# Patient Record
Sex: Female | Born: 1980 | Race: Black or African American | Hispanic: No | Marital: Single | Smoking: Current some day smoker
Health system: Southern US, Community
[De-identification: ages and names within clinical notes are randomized; demographics above are authoritative.]

---
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
## 2002-03-27 ENCOUNTER — Emergency Department (HOSPITAL_COMMUNITY): Admission: EM | Admit: 2002-03-27 | Discharge: 2002-03-27 | Payer: Self-pay | Admitting: Emergency Medicine

## 2002-11-18 ENCOUNTER — Emergency Department (HOSPITAL_COMMUNITY): Admission: EM | Admit: 2002-11-18 | Discharge: 2002-11-18 | Payer: Self-pay | Admitting: Emergency Medicine

## 2003-09-02 ENCOUNTER — Ambulatory Visit (HOSPITAL_COMMUNITY): Admission: RE | Admit: 2003-09-02 | Discharge: 2003-09-02 | Payer: Self-pay | Admitting: *Deleted

## 2003-09-02 ENCOUNTER — Encounter: Payer: Self-pay | Admitting: *Deleted

## 2003-12-04 ENCOUNTER — Ambulatory Visit (HOSPITAL_COMMUNITY): Admission: RE | Admit: 2003-12-04 | Discharge: 2003-12-04 | Payer: Self-pay | Admitting: *Deleted

## 2004-01-03 ENCOUNTER — Ambulatory Visit (HOSPITAL_COMMUNITY): Admission: EM | Admit: 2004-01-03 | Discharge: 2004-01-03 | Payer: Self-pay | Admitting: Emergency Medicine

## 2004-01-22 ENCOUNTER — Inpatient Hospital Stay (HOSPITAL_COMMUNITY): Admission: RE | Admit: 2004-01-22 | Discharge: 2004-01-25 | Payer: Self-pay | Admitting: *Deleted

## 2005-01-17 ENCOUNTER — Emergency Department (HOSPITAL_COMMUNITY): Admission: EM | Admit: 2005-01-17 | Discharge: 2005-01-17 | Payer: Self-pay | Admitting: Emergency Medicine

## 2005-02-01 ENCOUNTER — Emergency Department (HOSPITAL_COMMUNITY): Admission: EM | Admit: 2005-02-01 | Discharge: 2005-02-01 | Payer: Self-pay | Admitting: Emergency Medicine

## 2005-03-05 ENCOUNTER — Ambulatory Visit (HOSPITAL_COMMUNITY): Admission: RE | Admit: 2005-03-05 | Discharge: 2005-03-05 | Payer: Self-pay | Admitting: *Deleted

## 2005-05-14 ENCOUNTER — Emergency Department (HOSPITAL_COMMUNITY): Admission: EM | Admit: 2005-05-14 | Discharge: 2005-05-14 | Payer: Self-pay | Admitting: Emergency Medicine

## 2005-08-12 ENCOUNTER — Emergency Department (HOSPITAL_COMMUNITY): Admission: EM | Admit: 2005-08-12 | Discharge: 2005-08-13 | Payer: Self-pay | Admitting: Emergency Medicine

## 2005-11-29 ENCOUNTER — Emergency Department (HOSPITAL_COMMUNITY): Admission: EM | Admit: 2005-11-29 | Discharge: 2005-11-29 | Payer: Self-pay | Admitting: Emergency Medicine

## 2005-12-19 ENCOUNTER — Emergency Department (HOSPITAL_COMMUNITY): Admission: EM | Admit: 2005-12-19 | Discharge: 2005-12-19 | Payer: Self-pay | Admitting: Emergency Medicine

## 2005-12-22 ENCOUNTER — Emergency Department (HOSPITAL_COMMUNITY): Admission: EM | Admit: 2005-12-22 | Discharge: 2005-12-22 | Payer: Self-pay | Admitting: Emergency Medicine

## 2006-04-11 ENCOUNTER — Emergency Department (HOSPITAL_COMMUNITY): Admission: EM | Admit: 2006-04-11 | Discharge: 2006-04-11 | Payer: Self-pay | Admitting: Emergency Medicine

## 2007-03-17 ENCOUNTER — Emergency Department (HOSPITAL_COMMUNITY): Admission: EM | Admit: 2007-03-17 | Discharge: 2007-03-17 | Payer: Self-pay | Admitting: Emergency Medicine

## 2007-06-16 ENCOUNTER — Emergency Department (HOSPITAL_COMMUNITY): Admission: EM | Admit: 2007-06-16 | Discharge: 2007-06-16 | Payer: Self-pay | Admitting: Emergency Medicine

## 2007-11-05 ENCOUNTER — Emergency Department (HOSPITAL_COMMUNITY): Admission: EM | Admit: 2007-11-05 | Discharge: 2007-11-05 | Payer: Self-pay | Admitting: Emergency Medicine

## 2007-12-16 ENCOUNTER — Emergency Department (HOSPITAL_COMMUNITY): Admission: EM | Admit: 2007-12-16 | Discharge: 2007-12-16 | Payer: Self-pay | Admitting: Emergency Medicine

## 2008-02-04 ENCOUNTER — Emergency Department (HOSPITAL_COMMUNITY): Admission: EM | Admit: 2008-02-04 | Discharge: 2008-02-04 | Payer: Self-pay | Admitting: Emergency Medicine

## 2008-03-21 ENCOUNTER — Other Ambulatory Visit: Admission: RE | Admit: 2008-03-21 | Discharge: 2008-03-21 | Payer: Self-pay | Admitting: Obstetrics and Gynecology

## 2008-05-04 ENCOUNTER — Ambulatory Visit: Payer: Self-pay | Admitting: Obstetrics & Gynecology

## 2008-05-04 ENCOUNTER — Inpatient Hospital Stay (HOSPITAL_COMMUNITY): Admission: AD | Admit: 2008-05-04 | Discharge: 2008-05-04 | Payer: Self-pay | Admitting: Obstetrics & Gynecology

## 2008-06-03 ENCOUNTER — Ambulatory Visit: Payer: Self-pay | Admitting: Psychiatry

## 2008-06-03 ENCOUNTER — Other Ambulatory Visit: Payer: Self-pay | Admitting: Emergency Medicine

## 2008-06-04 ENCOUNTER — Inpatient Hospital Stay (HOSPITAL_COMMUNITY): Admission: RE | Admit: 2008-06-04 | Discharge: 2008-06-10 | Payer: Self-pay | Admitting: Psychiatry

## 2008-06-06 ENCOUNTER — Ambulatory Visit: Payer: Self-pay | Admitting: *Deleted

## 2008-08-16 ENCOUNTER — Inpatient Hospital Stay (HOSPITAL_COMMUNITY): Admission: AD | Admit: 2008-08-16 | Discharge: 2008-08-16 | Payer: Self-pay | Admitting: Obstetrics & Gynecology

## 2008-08-30 ENCOUNTER — Inpatient Hospital Stay (HOSPITAL_COMMUNITY): Admission: RE | Admit: 2008-08-30 | Discharge: 2008-09-02 | Payer: Self-pay | Admitting: Obstetrics and Gynecology

## 2008-08-30 ENCOUNTER — Encounter: Payer: Self-pay | Admitting: Obstetrics and Gynecology

## 2008-11-20 ENCOUNTER — Emergency Department (HOSPITAL_COMMUNITY): Admission: EM | Admit: 2008-11-20 | Discharge: 2008-11-20 | Payer: Self-pay | Admitting: Emergency Medicine

## 2009-02-28 ENCOUNTER — Emergency Department (HOSPITAL_COMMUNITY): Admission: EM | Admit: 2009-02-28 | Discharge: 2009-02-28 | Payer: Self-pay | Admitting: Emergency Medicine

## 2010-12-06 ENCOUNTER — Encounter: Payer: Self-pay | Admitting: Obstetrics & Gynecology

## 2010-12-07 ENCOUNTER — Encounter: Payer: Self-pay | Admitting: Obstetrics and Gynecology

## 2010-12-07 ENCOUNTER — Encounter: Payer: Self-pay | Admitting: *Deleted

## 2011-02-24 LAB — CULTURE, ROUTINE-ABSCESS

## 2011-03-30 NOTE — H&P (Signed)
NAME:  [name], [name] NO.:  [id number]   MEDICAL RECORD NO.:  [id number]           PATIENT TYPE:   LOCATION:                                 FACILITY:   PHYSICIAN:  Tilda Burrow, M.D. DATE OF BIRTH:  1981-07-24   DATE OF ADMISSION:  08/30/2008  DATE OF DISCHARGE:                              HISTORY & PHYSICAL   ADMITTING DIAGNOSES:  Pregnancy at 37 weeks and 6 days, twin pregnancy,  repeat C-section x2, not for trial of labor, growth discordancy, baby A  68th percentile, and baby B 35th percentile.   HISTORY OF PRESENT ILLNESS:  This 30 year old female gravida 4, para 2-0-  1-2 now at 37 weeks and 6 days by consensus criteria is followed by  pregnancy at [facility].  Pregnancy course has been notable for  moderate growth discordancy in the babies.  By 33 weeks, there was  asymmetry from the 23rd percentile for baby B and 55% percentile for  baby A.  Serial ultrasounds have been performed and at this point, both  babies were actually increasing in their growth percentile compared to  average infant.  On August 15, 2008, baby A measured 68th percentile and  baby B 35th percentile when checked at [facility].  NSTs have  been reactive biweekly, most recently August 26, 2008.  The patient  does not want tubal ligation.  Throughout her pregnancy, she states that  recently she has become ambivalent and changed her mind.  She is  undecided on her other contraception plan.   PRENATAL COURSE:  Blood type O positive.  UDS positive for __________  and cocaine __________.  She was counseled and we have no further  documentation of abnormalities.  She had an uneventful course otherwise.   SOCIAL HISTORY:  Interesting and the patient is getting ready to move to  [facility] with the patient relocating this week and she will be taking  the babies home to [place], [place] of state.   PAST SURGICAL HISTORY:  Positive for C-section x2 described as  uneventful.  The patient states that she felt the skin closure at the  second C-section.   ALLERGIES:  None.   SOCIAL HISTORY:  Single.  Lives with children and her grandmother.  She  is a Interior and spatial designer.   FAMILY HISTORY:  Positive for hypertension and diabetes.   PRENATAL COURSE:  Blood type O positive.  Rubella immune.  Hepatitis,  HIV, RPR, GC and chlamydia are all negative.  MSAFP normal.  Glucose  tolerance test 97 mg %.  Sickle trait negative.   PHYSICAL EXAMINATION:  VITAL SIGNS:  Weight 244 and blood pressure  140/80.  HEENT:  Pupils equal, round, and reactive to light.  NECK:  Supple.  CARDIAC:  Unremarkable.  Fundal height 45 cm with both babies vertex at  the last ultrasound.  NSD reactive on August 26, 2008, both x2 infants.   PLAN:  Repeat C-section at 2 p.m. on August 30, 2008.      Tilda Burrow, M.D.  Electronically Signed     JVF/MEDQ  D:  08/27/2008  T:  08/28/2008  Job:  [id number]   cc:   [facility]

## 2011-03-30 NOTE — Discharge Summary (Signed)
[name], [name]               ACCOUNT NO.:  [id number]   MEDICAL RECORD NO.:  [id number]          PATIENT TYPE:  INP   LOCATION:  9199                          [facility]ACILITY:  [facility]   PHYSICIAN:  Rodney Langton, MDDATE OF BIRTH:  Apr 05, 1981   DATE OF ADMISSION:  08/30/2008  DATE OF DISCHARGE:  09/02/2008                               DISCHARGE SUMMARY   REASON FOR ADMISSION:  Cesarean section for twins, Baby A: breech and  Baby B: transverse presentation.   DIAGNOSIS AT DISCHARGE:  Status post low-transverse cesarean section for  breech and transverse twins.   PROCEDURES DONE:  Low-transverse cesarean section and nonstress test.   HISTORY:  Briefly, this is a 30 year old G3, P3-0-0-4 female who  presented at 37.6 weeks.  The patient was admitted for repeat cesarean  section as she was not a candidate for trial of labor.  The procedure  went well and she delivered a 5-pound 6-ounce female via breech, first  with Apgars of 9 at one and 9 at five minutes and then a 7-pound 4-ounce  female with Apgars of 9 at one and 9 at five minutes, transverse.  The  patient followed an uneventful course in the hospital, and by discharge,  was ambulatory, eating, afebrile, and pain was controlled.   DIAGNOSIS AT DISCHARGE:  Status post normal spontaneous vaginal delivery  of twins.   DISCHARGE CONDITION:  Stable.   DISCHARGE DISPOSITION:  Will be to home.   ACTIVITY:  Pelvis rest for 6 weeks.   DIET:  Routine.   MEDICATIONS AT DISCHARGE:  1. Percocet 5/325 one to two tabs p.o. q.4-6 h. p.r.n. pain.  2. Colace 100 mg p.o. b.i.d.  3. Ibuprofen 600 mg p.o. q.6 h. p.r.n. pain.  4. Prenatal vitamins 1 tab p.o. daily.   FOLLOWUP:  Will be in 4 weeks at [facility].  The patient is also  instructed to return to [facility] in 2 days on September 04, 2008 to  have her staples removed.      Rodney Langton, MD  Electronically Signed     TT/MEDQ  D:  09/02/2008  T:  09/03/2008  Job:   [id number]

## 2011-03-30 NOTE — Op Note (Signed)
NAMESARIN, COMUNALE               ACCOUNT NO.:  [id number]   MEDICAL RECORD NO.:  [id number]          PATIENT TYPE:  INP   LOCATION:  9130                          [facility]ACILITY:  [facility]   PHYSICIAN:  Tilda Burrow, M.D. DATE OF BIRTH:  [birth year]   DATE OF PROCEDURE:  DATE OF DISCHARGE:                               OPERATIVE REPORT   PREOPERATIVE DIAGNOSIS:  Pregnancy 37 weeks and 6 days, twin gestation,  growth asymmetry, prior C-section x2, now for trial of labor, breech may  be A.   POSTOPERATIVE DIAGNOSES:  1. Pregnancy 37 weeks and 6 days, twin gestation, growth asymmetry,      prior c-section x2, now for trial of labor, breech may be A.  2. Back up tranverse, baby B.   PROCEDURE:  Repeat low-transverse cervical cesarean section.   SURGEON:  Tilda Burrow, M.D.   ASSISTANT:  None.   ANESTHESIA:  Spinal.  Brayton Caves, MD, Anesthesia.   COMPLICATIONS:  None.   FINDINGS:  A 5 pound 6 ounce (2440 g) female infant baby A in breech  position, 7 pound and 4 ounce of (3295 g) female baby B, Apgars 8 and 9 in  back up transverse position, thick uterine tissues with several small  fibroids over the surface of the uterus.  No adhesions.   DETAILS OF PROCEDURE:  The patient was taken to the operating room.  Spinal anesthesia introduced and timeout performed per protocol.  Pfannenstiel incision was performed with easy access to the abdominal  cavity.  Bladder flap was minimally developed and a transverse uterine  incision performed, extended laterally using index finger traction and  the baby A buttocks identified in the breech position.  Breech  extraction was performed using mild fundal pressure and delivery of the  buttocks, sweeping of the legs with flexion of the knees, sweeping of  the arms and then delivery of the head easily.  Cord was clamped of the  infant, baby A, female, passed to awaiting pediatrician.  The subsequent  delivery of baby B was performed with the baby  identified in a back  transverse position with vertex on the patient's right side.  Breech  extraction with fundal pressure applied.  It was easily performed with  standard sweeping of the arms in front of the body, the baby B was 7  pounds 4 ounces.  Apgars 9 and 9 were found.  Placenta was delivered by  Crede uterine massage with IV access infused.  Uterine tone was  excellent.  Small myomas could be palpated through the uterine wall.  The patient once again confirmed she did not want her tubes tied.  We  then proceeded with irrigation of the uterus with saline solution,  suctioning the abdomen, single layer of running locking closure of the  uterine incision.  Bladder flap was loosely reapproximated with 2  interrupted sutures of 2-0 chromic.  The abdomen was irrigated again.  Hemostasis was confirmed and the anterior peritoneum was closed with 2-0  chromic.  The fascia was closed with running 0 Vicryl and abdomen wall  laxity was rather marked.  Subcutaneous tissues were reapproximated  using interrupted 2-0 plain and staple closure of the skin completed the  procedure.  Pressure dressing was applied due to there was diffuse  oozing.   Sponge and needle counts were correct.   ESTIMATED BLOOD LOSS:  800 mL.      Tilda Burrow, M.D.  Electronically Signed     JVF/MEDQ  D:  08/30/2008  T:  08/31/2008  Job:  [id number]
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
## 2011-04-02 NOTE — H&P (Signed)
NAME:  [name], [name]                         ACCOUNT NO.:  [id number]   MEDICAL RECORD NO.:  [id number]                   PATIENT TYPE:  AMB   LOCATION:  DAY                                  [facility]ILITY:  [facility]   PHYSICIAN:  Langley Gauss, M.D.                DATE OF BIRTH:  10/28/1981   DATE OF ADMISSION:  01/22/2004  DATE OF DISCHARGE:                                HISTORY & PHYSICAL   The patient is a 30 year old gravida 2, para 1, previous C-section, at 66-  1/[redacted] weeks gestation, who is admitted for repeat cesarean section.  A tubal  ligation will not be performed.  The patient's prenatal course was  complicated by multiple drug screens which have been positive for marijuana.  Most recent drug screen done December 23, 2003, positive for marijuana.  All  other substances tested were noted to be negative.  December 03, 2003,  positive for marijuana, everything else negative.  However, early in her  prenatal course, specifically November 12, 2003, the patient was noted to  test positive for cocaine and also positive for marijuana.  Pertinently at  that time the patient was adamant regarding her denial of any cocaine usage.  She was advised of the detrimental and very adverse reaction on pregnancy  with cocaine usage.  She was, likewise, advised that she would be having  serial testing performed during the prenatal course.  Prior to that on  August 29, 2003, positive for cocaine and positive for marijuana.  The  patient on all previous occasions was adamant regarding her denial of  cocaine usage.  The patient is noted to be GBS carrier status negative on  January 08, 2004.  She has had serial ultrasounds which have documented  adequate fetal growth.  Glucose tolerance test during previous pregnancy was  noted to be normal.  The patient on October 15, 2003, was noted to have  Trichomonas on Pap smear.  She was treated at that time with Flagyl 500 mg  p.o. b.i.d. x7.  She denies any  abnormal discharge during the remainder of  the pregnancy.   PAST MEDICAL HISTORY:  No known drug allergies.   CURRENT MEDICATIONS:  Prenatal vitamins.   OBSTETRICAL HISTORY:  December 12, 2001, failure to progress, CPD, primary  low transverse cesarean section, delivery of 8-pound 8-ounce female infant.   SOCIAL HISTORY:  Father of the baby is named Candi Leash.  The patient  smokes 4 cigarettes per day.   PHYSICAL EXAMINATION:  GENERAL:  Black female.  VITAL SIGNS:  Height 5 feet 3, prepregnancy 238, most recent 235.  Blood  pressure 126/70, pulse rate of 90, respiratory rate 20.  HEENT:  Negative.  No adenopathy.  NECK:  Supple.  Thyroid is not palpable.  LUNGS:  Clear.  CARDIOVASCULAR:  Regular rate and rhythm.  ABDOMEN:  Soft and nontender.  Pfannenstiel incision is identified from  prior C-section.  She is vertex presentation by Thayer Ohm maneuver.  Fetal  heart tones are auscultated in the 150s.  EXTREMITIES:  Noted to be normal.   ASSESSMENT:  This is a 38-plus-week intrauterine pregnancy, for repeat low  transverse cesarean section.  The patient plans on bottle feeding.  She has  stated a desire for utilizing a skin implant for postpartum birth control  purposes.  The pediatric care is to be provided by Dr. Vivia Ewing.  The  risks and benefits of repeat cesarean section were discussed with the  patient.  Dr. Webb Laws office was contacted on December 24, 2003.  Discussion  and notification via Carollee Herter regarding the planned C-section.     ___________________________________________                                         Langley Gauss, M.D.   DC/MEDQ  D:  01/21/2004  T:  01/22/2004  Job:  [id number]

## 2011-04-02 NOTE — Discharge Summary (Signed)
NAME:  [name], [name]                         ACCOUNT NO.:  [id number]   MEDICAL RECORD NO.:  [id number]                   PATIENT TYPE:  INP   LOCATION:  A403                                 FACILITY:  [facility]   PHYSICIAN:  Langley Gauss, M.D.                DATE OF BIRTH:  11/28/1980   DATE OF ADMISSION:  01/22/2004  DATE OF DISCHARGE:  01/25/2004                                 DISCHARGE SUMMARY   DIAGNOSES:  1. Term pregnancy.  2. Previous Cesarean section.  3. Anemia secondary to blood loss.   PROCEDURE PERFORMED:  Repeat low transverse Cesarean section.   DISPOSITION:  The patient is advised to follow up in the office in four  day's time for removal of staples from Pfannenstiel incision and she is  given a copy of standardized discharge instructions at the time of  discharge.   DISCHARGE MEDICATIONS:  Tylox #30 with no refills.   PERTINENT LABORATORY STUDIES:  Admission hemoglobin and hematocrit 11.2/32.3  with white count 5.8. On postoperative day #1, hemoglobin 8.6, hematocrit  24.8 with a white count of 8.9. Intraoperative estimated blood loss 1000 cc.   HOSPITAL COURSE:  See previous dictation.  The patient came to the  ambulatory surgical unit on January 22, 2004, for elective repeat Cesarean  section. The repeat Cesarean section performed was performed without  complications. The patient did not desire sterilization. Postoperatively,  the patient has done well. Her postoperative course was complicated only by  development of postoperative ileus such that she had no significant passage  of flatus until late p.m. of January 24, 2004, thus discharged on January 25, 2004. The patient was not doing well with the p.o. Tylox. She is utilizing  Tylox for pain relief, doing well. She is bottle feeding at this time.   DISPOSITION:  Follow up in the office in four weeks time for subsequent  postoperative care. The patient will be considering birth control options in  the  meantime.     ___________________________________________                                         Langley Gauss, M.D.   DC/MEDQ  D:  01/25/2004  T:  01/25/2004  Job:  [id number]

## 2011-04-02 NOTE — Discharge Summary (Signed)
NAME:  [name], [name]                         ACCOUNT NO.:  [id number]   MEDICAL RECORD NO.:  [id number]                   PATIENT TYPE:  OIB   LOCATION:  A415                                 FACILITY:  [facility]   PHYSICIAN:  Langley Gauss, M.D.                DATE OF BIRTH:  07-11-1981   DATE OF ADMISSION:  01/03/2004  DATE OF DISCHARGE:  01/03/2004                                 DISCHARGE SUMMARY   HISTORY OF PRESENT ILLNESS:  A 30 year old gravida 2, para 1, 35-6/[redacted] weeks  gestation, presents with chief complaint of coughing with flu-like symptoms.  In addition, she states she has been leaking fluid from vagina x3-4 days  duration.  History is that she has felt a little bit moist in her  undergarments.  She has had no fluid running down her legs.  This has been 3-  4 days duration with no significant change in uterine activity.  She also  has dry, nonproductive cough.  Denies any fevers.   PRENATAL COURSE:  See prenatal records.   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   PAST MEDICAL HISTORY:  One primary low transverse cesarean section December 12, 2001, for CPD.   ALLERGIES:  No known drug allergies.   MEDICATIONS:  Prenatal vitamins.   PHYSICAL EXAMINATION:  GENERAL APPEARANCE:  No acute distress.  ABDOMEN:  Soft, nontender.  Gravid uterus identified with Pfannenstiel  incision.  Sterile speculum examination performed.  No leakage of fluid.  No  vaginal bleeding.  Nitrazine negative.  Dry slide obtained is fern negative  with fishy odor.  Digitally, cervix noted to be closed.  External fetal  monitor has no uterine activity.  Fetal heart rate is reassuring with  accelerations noted.  No decelerations noted.   ASSESSMENT:  1. A 35-3/7 weeks intrauterine pregnancy.  2. Previous low transverse cesarean section.  3. Upper respiratory infection, probable viral in nature.  4. Nonspecific vaginitis.   DISPOSITION:  The patient is treated with prescription for Tussionex 5 ml  p.o. b.i.d. p.r.n. cough.  In addition, Flagyl 500 mg p.o. b.i.d. x7.  Urine  drug screen is currently pending.  The patient is advised to continue with  scheduled prenatal visits.    ___________________________________________                                         Langley Gauss, M.D.   DC/MEDQ  D:  01/03/2004  T:  01/03/2004  Job:  [id number]

## 2011-04-02 NOTE — Op Note (Signed)
NAME:  [name], [name]                         ACCOUNT NO.:  [id number]   MEDICAL RECORD NO.:  [id number]                   PATIENT TYPE:  INP   LOCATION:  A403                                 FACILITY:  [facility]   PHYSICIAN:  Langley Gauss, M.D.                DATE OF BIRTH:  11-May-1981   DATE OF PROCEDURE:  01/22/2004  DATE OF DISCHARGE:                                 OPERATIVE REPORT   PREOPERATIVE DIAGNOSES:  1. A 39-week intrauterine pregnancy.  2. Previous low transverse cesarean section; planned repeat cesarean     section.   POSTOPERATIVE DIAGNOSES:  1. A 39-week intrauterine pregnancy.  2. Previous low transverse cesarean section; planned repeat cesarean     section.   PROCEDURE:  Repeat low transverse cesarean section with delivery of 6 pound  13 ounce female infant.   SURGEON:  Roylene Reason. Lisette Grinder, M.D.   ESTIMATED BLOOD LOSS:  1000 cc.   ANALGESIA:  Spinal.   DRAINS:  1. JP in the subcutaneous space.  2. Foley catheter in bladder.   SPECIMENS:  Arterial cord gas and cord blood to pathology laboratory.  The  placenta is examined and noted to be apparently intact with a 3-vessel  umbilical cord.  Tubes and ovaries noted to be normal in appearance.  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   SUMMARY:  The patient was taken to the operating room; vital signs stable;  the patient had spinal analgesic administered without complications and  placed on the OR table with a slight left lateral tilt; prepped and draped  in the usual sterile manner.   After assurance of adequate surgical analgesia, a Pfannenstiel incision is  performed dissected down to the fascial plane utilizing a combination of  sharp dissection, as well as cauterization of bleeders.  The fascia is then  incised in a transverse curvilinear manner utilizing the Mayo scissors while  sharply dissecting off the underlying rectus  muscles.  The fascial edges are  then grasped using straight Kocher clamps.  The fascia was dissected off the  underlying rectus muscle in the midline, both superiorly and inferiorly,  utilizing the Mayo scissors.  The rectus muscles were then bluntly  separated.  The peritoneal cavity was atraumatically, bluntly entered at the  superior-most portion of the incision.  The peritoneal incision extended  superiorly and inferiorly.   The bladder blade is then identified.  A bladder flap is then created from  the vesicouterine fold utilizing sharp dissection.  A knife was then used to  score a low transverse uterine incision.  The intact amniotic sac was  encountered in the midline.  Allis clamp was used to perform artifical  rupture of membranes.  Clear amniotic fluid is noted.  My index finger is  then used to extend the low transverse uterine incision.  My right  hand then  reached into the uterine cavity.  The head of the infant is flexed and  elevated to the level of the uterine incision.  The disposable Mighty Vac  suction, connected to wall suction, is then placed on the  infant's vertex.  Gentle traction resulted in very easy delivery of this vertex through the  uterine incision.  The mouth and nares bulb suctioned of clear amniotic  fluid.  Gentle traction then resulted in the delivery of the remainder of  the infant without difficulty.  The umbilical cord was milked towards the  infant.  The cord was doubly clamped and cut.  A spontaneous cry is noted.  The infant is handed off to the waiting pediatrician, Dr. Vivia Ewing.   Arterial cord gas and cord blood are then obtained.  The placenta is  manually removed.  Intrauterine exploration reveals no retained placental  fragments.  The uterus is exteriorized.  The uterine incision is noted not  to have extended.  The uterine incision is closed in 2 layers of #0 chromic  in a running lock fashion; second layer being an imbricating layer.   This  results in excellent hemostasis.  The cul-de-sac is then irrigated free of  all clots.  The uterus is returned to the pelvic cavity.  Sponge, needle,  and instrument counts are correct x2 at this point in time.   The peritoneal edges are grasped using Kelly clamps.  The peritoneum is  closed with a continuous running #0 chromic suture.  Rectus muscle was  reapproximated utilizing continuous running #0 chromic suture.  No  subfascial bleeders are identified.  The fascia is closed with a looped #1  PDS suture.  The subcutaneous layers were then cauterized.  A JP drain is  placed in the subcutaneous space with a separate exit wound to the left apex  of the incision.  Three horizontal mattress sutures of #1 PDS suture are  then placed as retention-type suture.  The skin is then closed utilizing  skin staples.  A total of 30 cc of 0.5% bupivacaine plain is injected along  the skin incision to facilitate postoperative analgesia.  The patient  tolerated the procedure very well.  She was taken to the recovery room in  stable condition.  The report is that the infant is doing very well in the  nursery.      ___________________________________________                                            Langley Gauss, M.D.   DC/MEDQ  D:  01/22/2004  T:  01/22/2004  Job:  [id number]

## 2011-04-02 NOTE — H&P (Signed)
[facility]  Patient:    [name], [name] Visit Number: [id number] MRN: [id number]          Service Type: MED Location: 4A A426 01 Attending Physician:  Jeri Cos. Dictated by:   Langley Gauss, M.D. Admit Date:  12/11/2001                           History and Physical  HISTORY OF PRESENT ILLNESS:  [age]-year-old gravida 1, para 0 at [redacted] weeks gestation who is admitted for induction of labor secondary to findings of narrowing pelvic outlet on clinical pelvimetry with the clinical finding of increased risk for CPD.  Patients prenatal course had been complicated only by late presentation, presented to labor and delivery at [redacted] weeks gestation as an OB unassigned patient.  Anatomic survey at 22 weeks was noted to be normal.  Patients glucose tolerance test was normal at 77.  AFP triple screen was not performed as patient presented to the office for office care too late.  ALLERGIES:  Patient states she is allergic to PENICILLIN with an unknown reaction.  She states that her grandmother told her that she was allergic.  PAST MEDICAL HISTORY:   Patient is noted to have had pyelonephritis.  Patient is noted to have no other problems during the pregnancy.  She has no other medical or surgical history.  PHYSICAL EXAMINATION:  VITAL SIGNS:  Height is 5 foot 4 inches.  Prepregnancy weight 180 to 190 pounds; todays weight is 224 pounds.  Blood pressure 140/60, pulse rate of 80, respiratory rate is 20.  HEENT:  Negative.  NECK:  There is no adenopathy.  Neck is supple.  Thyroid is not palpable.  LUNGS:  Clear.  CARDIOVASCULAR:  Regular rate and rhythm.  ABDOMEN:  Soft and nontender.  No surgical scars are identified.  Patient is vertex presentation by Leopolds maneuvers.  EXTREMITIES:  Normal.  PELVIC:  Normal external genitalia.  No lesions or ulcerations identified.  No evidence of any vaginal bleeding or leakage of fluid.  Sterile  digital examination reveals cervix to be posterior, presenting part vertex at a -2 station, not engaged, cervix difficult to evaluate secondary to narrow pelvic outlet and ______ combination of parts of the bony pelvis as well as muscular contraction by the patient.  ASSESSMENT:  Gravida 1, para 0 at [redacted] weeks gestation with a probable relative cephalopelvic disproportion on examination.  Patient is complaining of irregular uterine contractions but not significant enough to be in labor.  Due to the unfavorable nature of the cervix, patient will be referred to the hospital, at which time initially Foley bulb catheter will be placed for mechanical ripening of the cervix, followed by amniotomy on December 12, 2001. The patient will be utilizing pediatrician on call.  She plans on probably pumping her breasts for feeding the infant.  The patient has been advised to present to labor and delivery at 1900 today, scheduling permitting.  Best phone number she could be reached today would be [phone] if any changes in plans become necessary. Dictated by:   Langley Gauss, M.D. Attending Physician:  Jeri Cos. DD:  12/11/01 TD:  12/11/01 Job: [id number]

## 2011-04-02 NOTE — H&P (Signed)
[name], [name]               ACCOUNT NO.:  [id number]   MEDICAL RECORD NO.:  [id number]          PATIENT TYPE:  AMB   LOCATION:  DAY                           FACILITY:  [facility]   PHYSICIAN:  Langley Gauss, MD     DATE OF BIRTH:  [birth year]   DATE OF ADMISSION:  03/05/2005  DATE OF DISCHARGE:  LH                                HISTORY & PHYSICAL   PROCEDURE:  Suction dilatation and curettage.   DIAGNOSES:  Irregular bleeding, as a complication of induced abortion.   HISTORY:  The patient is a 30 year old gravida 3, para 2 with two prior low  transverse cesarean sections, who underwent elective termination of  pregnancy on January 13, 2005 in [place].  She states that the operative  procedure itself was without complications; however, she does state that she  was in very much pain and had difficulty remaining still during the  procedure.  She was sent home and did comply with seven days of p.o.  doxycycline.  The patient failed to keep her 1 or 2 week follow-up  appointment at the abortion clinic but does state that they did have phone  numbers to contact her, and she was not contacted by them regarding any  potential problems after the abortion procedure.  Patient states that since  that procedure has been performed, she has had heavy and irregular bleeding.  She states that since the date of that termination, she has had only one  week without bleeding.  She has had significant amount of cramping and  passage of clots.  Actually presented to [facility] emergency room one week  post procedure.  Was seen by the emergency room physician only.  By her  report, laboratory studies were performed.  She was treated with IV  analgesic only.  Was sent home from the emergency room.  No consultation was  obtained with any GYN physicians.  She also provides a history that on February 17, 2005, for one day only, she passed what she describes as meat.  Cramping is severe in nature with passage  frequently of golf-ball-size  clots.  It is only within the past one week where the bleeding has slacked  up appreciably at all.  She was prescribed birth control pills following the  procedure.  She did only take one week's worth of birth control pill  medication, after which time she discontinued its use.   PAST MEDICAL HISTORY:  Prior low transverse cesarean section on January 15, 2004.  Low transverse cesarean section on November 16, 2001.  Patient  previously had an IUD, which was removed on June 16, 2005.  She has not  been on any birth control since that point in time.  She is sexually active.  She did have the unplanned pregnancy and underwent the termination of  pregnancy, as described previously.  The patient did have a positive  gonorrheal culture on July 22, 2004, which was treated with p.o. Cipro.  Subsequent tests were negative for gonorrhea and Chlamydia.  GC and  Chlamydia cultures are repeated on today's visit.  The most recent  laboratory studies reveal a normal class I Pap smear on May 16, 2004.   Patient states that she is allergic to PENICILLIN with some unknown  childhood reaction only.  She is not unaware of the specific reaction that  occurred.  She has been prescribed Keflex without any adverse allergic-type  reactions.   She denies any other medical history, pertinently, no history of diabetes or  hypertension.  No other surgical procedure.   PHYSICAL EXAMINATION:  VITAL SIGNS:  Weight is 212-1/2.  Pulse is 75.  Blood  pressure 130/85.  HEENT:  Negative.  NECK:  No adenopathy.  Neck is supple.  Thyroid is nonpalpable.  LUNGS:  Clear.  CARDIOVASCULAR:  Regular rate and rhythm.  ABDOMEN:  Soft and nontender.  Prior Pfannenstiel incision from C-section.  EXTREMITIES:  Noted to be normal.  PELVIC:  Normal external genitalia.  She is noted to have some blood on the  labia.  Sterile speculum examination is performed.  Cervix does appear to be  closed, but there  is some mucus-like, stringy substance present and a small  amount of active bleeding from the uterine cavity.  On bimanual examination,  the cervix is closed.  No cervical motion tenderness appreciated.  GC and  Chlamydia cultures performed.  Bimanual examination reveals a 10-week size  multiparous uterus with no adnexal masses.  The uterus is nontender.   RADIOLOGICAL STUDIES:  A non-OB transabdominal ultrasound, limited,  performed by Dr. Roylene Reason. Lisette Grinder in the office, dated March 04, 2005.  This reveals no free fluid within the pelvis.  Maximum longitudinal length  of the uterus, body of the uterus itself, measures a length of 7.23 cm.  The  fundus is noted to be very bulbous and thickened.  Endometrial stripe is  noted to be markedly thickened and inhomogeneous in its appearance with a  maximum diameter of 1.75 cm.   ASSESSMENT/PLAN:  Laboratory studies requested.  CBC, quantitative beta HCG.  The impression is that of bleeding with resultant anemia as a complication  of surgically performed termination of pregnancy on January 13, 2005 in  [place].  Sounds as though the procedure was technically difficult due to  the patient's poor tolerance of the procedure, further increasing the risks  of retained tissue.  Thus, the patient is instructed to be n.p.o. past  midnight, and on March 05, 2005, plan on taking the patient to the operating  room at [facility] and performing suction dilatation and curettage  for complete evacuation of uterine contents.  Messages left on Kim Payne's  answering machine.  Patient is advised that she will likely be contacted  shortly after 0830 a.m., but she should be prepared to be at the hospital as  early as 0900 for preparation for the procedure with the exact timing  depending upon currently scheduled operative cases.      DC/MEDQ  D:  03/04/2005  T:  03/04/2005  Job:  [id number]

## 2011-04-02 NOTE — Op Note (Signed)
[name], [name]               ACCOUNT NO.:  [id number]   MEDICAL RECORD NO.:  [id number]          PATIENT TYPE:  AMB   LOCATION:  DAY                           FACILITY:  [facility]   PHYSICIAN:  Langley Gauss, MD     DATE OF BIRTH:  05-05-1981   DATE OF PROCEDURE:  03/05/2005  DATE OF DISCHARGE:                                 OPERATIVE REPORT   DIAGNOSES:  Status post first trimester termination of pregnancy with  retained products of conception.   OPERATION/PROCEDURE:  Suction dilatation and curettage with evacuation of  uterine contents.  This was noted to be a complication of a surgically  induced elective abortion.   SURGEON:  Langley Gauss, M.D.   ESTIMATED BLOOD LOSS:  100 mL.   ANESTHESIA:  General endotracheal anesthesia.   FINDINGS:  Minimally dilated cervix, moderate amounts of products of  conception obtained.  Uterus is noted to sound to a depth of 10 cm.   SUMMARY:  The planned procedure was discussed with the patient in the  preoperative holding area.  She was taken to the operating room where she  underwent uncomplicated induction of general endotracheal anesthesia.  She  then placed in the dorsal lithotomy position, prepped and draped in the  usual sterile manner.  Speculum was placed. Anterior lip of the cervix was  grasped with a single-tooth tenaculum.  Minimal bleeding was noted be  occurring at this time.  Cervix likewise was not markedly dilated.  This,  however, was sufficiently dilated to allow passage of a sound which revealed  a retroflexed uterus that sounds to a depth of 10 cm.  The 8 mm suction  catheter is required.  Prior to insertion of this, we used a Hegar dilator  to dilate up to a size #21 dilator.  This allowed passage of #8 suction  catheter tip into the cervix and lower uterine segment at which time  suctioning was performed of the entire uterine cavity with moderate amounts  of products of conception obtained.  Two passes were  performed followed by  fine banjo curet of the uterine cavity with only minimal further tissue  obtained.  Bimanual massage of the uterus was performed in an effort to  shrink the uterus.  This did well.  There was some minimal active vaginal  bleeding. Thus the patient was reversed of anesthesia, taken to the recovery  room in stable condition at which time the operative findings were discussed  with her awaiting partner and likewise operative findings discussed with the  patient herself.   DISCHARGE MEDICATIONS:  Vicoprofen for pain relief.  Pertinently, around the  time of the initial termination, January 13, 2005, the patient did take a 7-day  course of p.o. doxycycline.   She is discharged at this time and given instructions to follow up in the  office in one week's time or sooner if problems or concerns arise.      DC/MEDQ  D:  03/08/2005  T:  03/08/2005  Job:  [id number]

## 2011-04-02 NOTE — Discharge Summary (Signed)
[facility]  Patient:    [name], [name] Visit Number: [id number] MRN: [id number]          Service Type: MED Location: 4A A426 01 Attending Physician:  Jeri Cos. Dictated by:   Langley Gauss, M.D. Admit Date:  12/11/2001 Discharge Date: 12/15/2001                             Discharge Summary  DIAGNOSES:  Term intrauterine pregnancy for induction of labor.  PROCEDURE:  December 11, 2001:  Placement of Foley bulb for mechanical ripening of the cervix.  December 12, 2001:  Diagnosis of cephalopelvic disproportion made.  Patient was taken to the operating room where a primary low transverse cesarean section, delivery of an 8 pound 7 ounce female infant performed with an estimated blood loss of 650 cc.  Spinal analgesic was utilized.  DISPOSITION:  Patient is bottle feeding at time of discharge.  DISCHARGE INSTRUCTIONS:  Patient is to follow up in the office on December 18, 2001 for staple removal.  DISCHARGE MEDICATIONS:  Tylox and Motrin for pain relief.  LABORATORIES:  Hemoglobin and hematocrit on admission 11.9/34.3, postoperative day #1 9.3/26.2.  HOSPITAL COURSE:  See previous dictations.  On December 12, 2001 the patient was noted to have failure to progress secondary to cephalopelvic disproportion.  The operative procedure was performed without complications with spinal analgesic administered and functioning very well.  Postoperatively the patient did well.  She had excellent urine output.  Vital signs remained stable.  After removal of the Foley catheter patient was able to ambulate and void without difficulty.  Vital signs again remained stable.  A JP drain within the subcutaneous space was removed on postoperative day #2 at which time the abdomen was soft, nondistended.  Incision was well approximated with minimal erythema, minimal induration.  Patient had resumption of normal bowel function, remained afebrile.  She was thus  discharged home on postoperative day #2 1/2 with instructions as previously stated to follow up for staple removal. Dictated by:   Langley Gauss, M.D. Attending Physician:  Jeri Cos. DD:  12/21/01 TD:  12/22/01 Job: [id number]

## 2011-08-09 LAB — URINALYSIS, ROUTINE W REFLEX MICROSCOPIC
Ketones, ur: NEGATIVE
Protein, ur: 30 — AB
Urobilinogen, UA: 0.2

## 2011-08-09 LAB — CBC
HCT: 36.8
MCHC: 35
MCV: 88.8
Platelets: 269
RDW: 12.9
WBC: 5.6

## 2011-08-09 LAB — URINE MICROSCOPIC-ADD ON

## 2011-08-09 LAB — BASIC METABOLIC PANEL
BUN: 6
CO2: 26
Chloride: 102
Glucose, Bld: 82
Potassium: 3.8

## 2011-08-09 LAB — DIFFERENTIAL
Basophils Relative: 0
Eosinophils Absolute: 0
Eosinophils Relative: 1
Lymphs Abs: 1.6

## 2011-08-12 LAB — URINALYSIS, ROUTINE W REFLEX MICROSCOPIC
Glucose, UA: NEGATIVE
Ketones, ur: NEGATIVE
Protein, ur: NEGATIVE

## 2011-08-13 LAB — POCT URINALYSIS DIP (DEVICE)
Operator id: 297281
Protein, ur: NEGATIVE
Specific Gravity, Urine: 1.015
pH: 7

## 2011-08-13 LAB — RAPID URINE DRUG SCREEN, HOSP PERFORMED
Amphetamines: NOT DETECTED
Benzodiazepines: NOT DETECTED

## 2011-08-13 LAB — DIFFERENTIAL
Lymphocytes Relative: 20
Lymphs Abs: 1.9
Neutrophils Relative %: 70

## 2011-08-13 LAB — CBC
Platelets: 252
WBC: 9.4

## 2011-08-13 LAB — ACETAMINOPHEN LEVEL: Acetaminophen (Tylenol), Serum: <10 — ABNORMAL LOW

## 2011-08-13 LAB — BASIC METABOLIC PANEL
BUN: 2 — ABNORMAL LOW
Creatinine, Ser: 0.52
GFR calc non Af Amer: >60

## 2011-08-13 LAB — ETHANOL: Alcohol, Ethyl (B): <5

## 2011-08-13 LAB — SALICYLATE LEVEL: Salicylate Lvl: <4

## 2011-08-13 LAB — RPR: RPR Ser Ql: NONREACTIVE

## 2011-08-13 LAB — GC/CHLAMYDIA PROBE AMP, GENITAL: GC Probe Amp, Genital: NEGATIVE

## 2011-08-13 LAB — WET PREP, GENITAL: Yeast Wet Prep HPF POC: NONE SEEN

## 2011-08-16 LAB — CBC
HCT: 28.6 — ABNORMAL LOW
HCT: 33.7 — ABNORMAL LOW
Hemoglobin: 11.3 — ABNORMAL LOW
Hemoglobin: 9.7 — ABNORMAL LOW
MCV: 92.2
Platelets: 242
RBC: 3.08 — ABNORMAL LOW
RDW: 14.2
WBC: 5.6
WBC: 6.3

## 2011-08-16 LAB — CROSSMATCH

## 2011-08-16 LAB — ABO/RH: ABO/RH(D): O POS

## 2016-08-06 ENCOUNTER — Emergency Department (HOSPITAL_COMMUNITY)
Admission: EM | Admit: 2016-08-06 | Discharge: 2016-08-06 | Disposition: A | Discharge status: Home / Self Care | Payer: Medicaid Other | Attending: Emergency Medicine | Admitting: Emergency Medicine

## 2016-08-06 ENCOUNTER — Encounter (HOSPITAL_COMMUNITY): Payer: Self-pay

## 2016-08-06 DIAGNOSIS — Z791 Long term (current) use of non-steroidal anti-inflammatories (NSAID): Secondary | ICD-10-CM | POA: Insufficient documentation

## 2016-08-06 DIAGNOSIS — F17200 Nicotine dependence, unspecified, uncomplicated: Secondary | ICD-10-CM | POA: Insufficient documentation

## 2016-08-06 DIAGNOSIS — Z79899 Other long term (current) drug therapy: Secondary | ICD-10-CM | POA: Insufficient documentation

## 2016-08-06 DIAGNOSIS — K029 Dental caries, unspecified: Secondary | ICD-10-CM | POA: Diagnosis not present

## 2016-08-06 DIAGNOSIS — H9202 Otalgia, left ear: Secondary | ICD-10-CM | POA: Diagnosis present

## 2016-08-06 MED ORDER — TRAMADOL HCL 50 MG PO TABS
100.0000 mg | ORAL_TABLET | Freq: Once | ORAL | Status: DC
  Filled 2016-08-06: qty 2

## 2016-08-06 MED ORDER — PENICILLIN V POTASSIUM 500 MG PO TABS: 500.0000 mg | ORAL_TABLET | Freq: Four times a day (QID) | ORAL | 0 refills | Status: DC

## 2016-08-06 MED ORDER — IBUPROFEN 800 MG PO TABS
800.0000 mg | ORAL_TABLET | Freq: Once | ORAL | Status: AC
  Administered 2016-08-06: 800 mg via ORAL
  Filled 2016-08-06: qty 1

## 2016-08-06 MED ORDER — TRAMADOL HCL 50 MG PO TABS: 100.0000 mg | ORAL_TABLET | Freq: Four times a day (QID) | ORAL | 0 refills | Status: DC | PRN

## 2016-08-06 MED ORDER — ACETAMINOPHEN 500 MG PO TABS
1000.0000 mg | ORAL_TABLET | Freq: Once | ORAL | Status: AC
  Administered 2016-08-06: 1000 mg via ORAL
  Filled 2016-08-06: qty 2

## 2016-08-06 MED ORDER — PENICILLIN V POTASSIUM 250 MG PO TABS
500.0000 mg | ORAL_TABLET | Freq: Four times a day (QID) | ORAL | Status: DC
  Administered 2016-08-06: 500 mg via ORAL
  Filled 2016-08-06: qty 2

## 2016-08-06 NOTE — ED Provider Notes (Signed)
[facility]-EMERG[facility]Y DEPT Provider Note   CSN: [id number] Arrival date & time: 08/06/16  0051  Time seen 01:10 AM   History   Chief Complaint Chief Complaint  Patient presents with  . Otalgia    HPI [name] is a 35 y.o. female.  HPI patient complains of left ear pain that started about 3-4 days ago. She denies any drainage of her ear. She denies any sore throat. She is unaware of fever. She denies any drainage from her eyes. She denies nausea or vomiting. She does report she has some pain in her teeth on the left and it hurts when she chews. She denies any swimming or showering more than once a day. She denies hearing loss.   PCP none  History reviewed. No pertinent past medical history.  There are no active problems to display for this patient.   Past Surgical History:  Procedure Laterality Date  . CESAREAN SECTION      OB History    No data available       Home Medications    Prior to Admission medications   Medication Sig Start Date End Date Taking? Authorizing Provider  acetaminophen (TYLENOL) 325 MG tablet Take 650 mg by mouth every 6 (six) hours as needed.   Yes Historical Provider, MD  ibuprofen (ADVIL,MOTRIN) 600 MG tablet Take 600 mg by mouth every 6 (six) hours as needed.   Yes Historical Provider, MD  penicillin v potassium (VEETID) 500 MG tablet Take 1 tablet (500 mg total) by mouth 4 (four) times daily. 08/06/16   Devoria Albe, MD  traMADol (ULTRAM) 50 MG tablet Take 2 tablets (100 mg total) by mouth every 6 (six) hours as needed. 08/06/16   Devoria Albe, MD    Family History No family history on file.  Social History Social History  Substance Use Topics  . Smoking status: Current Every Day Smoker  . Smokeless tobacco: Never Used  . Alcohol use No  Moved from [place] recently although she lived here in the past   Allergies   Review of patient's allergies indicates no known allergies.   Review of Systems Review of Systems  All other systems  reviewed and are negative.    Physical Exam Updated Vital Signs BP 143/96 (BP Location: Left Arm)   Pulse 90   Temp 98 F (36.7 C) (Oral)   Resp 18   Ht 5\' 4"  (1.626 m)   Wt 260 lb (117.9 kg)   LMP 07/26/2016   SpO2 100%   BMI 44.63 kg/m   Vital signs normal    Physical Exam  Constitutional: She is oriented to person, place, and time. She appears well-developed and well-nourished.  Non-toxic appearance. She does not appear ill. No distress.  HENT:  Head: Normocephalic and atraumatic.    Right Ear: External ear normal.  Left Ear: External ear normal.  Nose: Nose normal. No mucosal edema or rhinorrhea.  Mouth/Throat: Oropharynx is clear and moist and mucous membranes are normal. No dental abscesses or uvula swelling.    Patient is very tender when I palpate her face over the area of her left upper molar that has the large cavity, she practically jumps off the stretcher. She has mild tenderness to tragal pulling on the left however her left ear canal and TM are totally normal. She has diffuse tenderness around her left ear but she appears to be most tender over the tooth.  Eyes: Conjunctivae and EOM are normal. Pupils are equal, round, and reactive  to light.  Neck: Normal range of motion and full passive range of motion without pain. Neck supple.  Cardiovascular: Normal rate, regular rhythm and normal heart sounds.  Exam reveals no gallop and no friction rub.   No murmur heard. Pulmonary/Chest: Effort normal and breath sounds normal. No respiratory distress. She has no wheezes. She has no rhonchi. She has no rales. She exhibits no tenderness and no crepitus.  Abdominal: Soft. Normal appearance and bowel sounds are normal. She exhibits no distension. There is no tenderness. There is no rebound and no guarding.  Musculoskeletal: Normal range of motion. She exhibits no edema or tenderness.  Moves all extremities well.   Neurological: She is alert and oriented to person, place, and  time. She has normal strength. No cranial nerve deficit.  Skin: Skin is warm, dry and intact. No rash noted. No erythema. No pallor.  Psychiatric: She has a normal mood and affect. Her speech is normal and behavior is normal. Her mood appears not anxious.  Nursing note and vitals reviewed.    ED Treatments / Results   Procedures Procedures (including critical care time)  Medications Ordered in ED Medications  penicillin v potassium (VEETID) tablet 500 mg (500 mg Oral Given 08/06/16 0134)  ibuprofen (ADVIL,MOTRIN) tablet 800 mg (800 mg Oral Given 08/06/16 0134)  acetaminophen (TYLENOL) tablet 1,000 mg (1,000 mg Oral Given 08/06/16 0134)     Initial Impression / Assessment and Plan / ED Course  I have reviewed the triage vital signs and the nursing notes.  Pertinent labs & imaging results that were available during my care of the patient were reviewed by me and considered in my medical decision making (see chart for details).  Clinical Course   At this point I feel the patient ear pain is referred pain from her dental caries in her left molars. She was started on Pen-Vee K and ibuprofen and acetaminophen for her pain. She states she knows again a sheet seen in the past and will follow up with the dentist.  Final Clinical Impressions(s) / ED Diagnoses   Final diagnoses:  Dental caries  Referred ear pain, left    New Prescriptions New Prescriptions   PENICILLIN V POTASSIUM (VEETID) 500 MG TABLET    Take 1 tablet (500 mg total) by mouth 4 (four) times daily.   TRAMADOL (ULTRAM) 50 MG TABLET    Take 2 tablets (100 mg total) by mouth every 6 (six) hours as needed.  OTC motrin and acetaminophen  Plan discharge  Devoria AlbeIva Beckett Hickmon, MD, Concha PyoFACEP    Chaseton Yepiz, MD 08/06/16 [phone]

## 2016-08-06 NOTE — Discharge Instructions (Signed)
Take the medications as prescribed with ibuprofen 600 mg + acetaminophen 1000 mg 4 times a day for pain. Follow up with your dentist about the tooth (molar) with the large cavity in your upper left mouth.   Recheck if you get a fever, facial swelling or seem worse.

## 2016-08-06 NOTE — ED Notes (Signed)
Pt c/o left side earache for the past few days, EDP in prior to RN, see edp assessment for further,

## 2016-08-06 NOTE — ED Triage Notes (Signed)
Left ear pain for several days, states has been taking otc meds without relief.  Pt states the pain goes down into her neck on the same side.

## 2018-10-16 ENCOUNTER — Emergency Department (HOSPITAL_BASED_OUTPATIENT_CLINIC_OR_DEPARTMENT_OTHER)
Admission: EM | Admit: 2018-10-16 | Discharge: 2018-10-16 | Disposition: A | Discharge status: Home / Self Care | Payer: Medicaid Other | Attending: Emergency Medicine | Admitting: Emergency Medicine

## 2018-10-16 ENCOUNTER — Encounter (HOSPITAL_BASED_OUTPATIENT_CLINIC_OR_DEPARTMENT_OTHER): Payer: Self-pay | Admitting: Emergency Medicine

## 2018-10-16 ENCOUNTER — Other Ambulatory Visit: Payer: Self-pay

## 2018-10-16 DIAGNOSIS — F17210 Nicotine dependence, cigarettes, uncomplicated: Secondary | ICD-10-CM | POA: Insufficient documentation

## 2018-10-16 DIAGNOSIS — H66005 Acute suppurative otitis media without spontaneous rupture of ear drum, recurrent, left ear: Secondary | ICD-10-CM | POA: Insufficient documentation

## 2018-10-16 MED ORDER — AMOXICILLIN-POT CLAVULANATE 875-125 MG PO TABS: 1.0000 | ORAL_TABLET | Freq: Two times a day (BID) | ORAL | 0 refills | Status: DC

## 2018-10-16 MED FILL — AMOX-CLAV 875-125 MG TABLET: 875-125 | 10 days supply | Qty: 20 | Fill #0
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
## 2018-10-16 NOTE — ED Triage Notes (Signed)
Reports left ear and left facial pain x 1 moth.  Reports she recently finished antibiotics for dental abscess but continues to have pain from that tooth.

## 2018-10-16 NOTE — Discharge Instructions (Signed)
Please read and follow all provided instructions.  Your diagnoses today include:  1. Recurrent acute suppurative otitis media without spontaneous rupture of left tympanic membrane     Tests performed today include:  Vital signs. See below for your results today.   Medications prescribed:   Augmentin - antibiotic  You have been prescribed an antibiotic medicine: take the entire course of medicine even if you are feeling better. Stopping early can cause the antibiotic not to work.  Take any prescribed medications only as directed.  Home care instructions:  Follow any educational materials contained in this packet.  BE VERY CAREFUL not to take multiple medicines containing Tylenol (also called acetaminophen). Doing so can lead to an overdose which can damage your liver and cause liver failure and possibly death.   Follow-up instructions: Please follow-up with your primary care provider in the next 5 days for recheck of your symptoms.   Return instructions:   Please return to the Emergency Department if you experience worsening symptoms.   Please return if you have any other emergent concerns.  Additional Information:  Your vital signs today were: BP (!) 143/88 (BP Location: Left Arm)    Pulse 86    Temp 98.9 F (37.2 C) (Oral)    Resp 20    Ht 5\' 3"  (1.6 m)    Wt 127 kg    LMP 10/16/2018 (Approximate)    SpO2 98%    BMI 49.60 kg/m  If your blood pressure (BP) was elevated above 135/85 this visit, please have this repeated by your doctor within one month. --------------

## 2020-04-22 ENCOUNTER — Ambulatory Visit (INDEPENDENT_AMBULATORY_CARE_PROVIDER_SITE_OTHER): Payer: Self-pay | Admitting: Primary Care

## 2020-09-05 ENCOUNTER — Emergency Department (HOSPITAL_COMMUNITY): Payer: Medicaid Other

## 2020-09-05 ENCOUNTER — Emergency Department (HOSPITAL_COMMUNITY)
Admission: EM | Admit: 2020-09-05 | Discharge: 2020-09-05 | Disposition: A | Discharge status: Home / Self Care | Payer: Medicaid Other | Attending: Emergency Medicine | Admitting: Emergency Medicine

## 2020-09-05 ENCOUNTER — Other Ambulatory Visit: Payer: Self-pay

## 2020-09-05 DIAGNOSIS — R079 Chest pain, unspecified: Secondary | ICD-10-CM | POA: Insufficient documentation

## 2020-09-05 DIAGNOSIS — S0093XA Contusion of unspecified part of head, initial encounter: Secondary | ICD-10-CM

## 2020-09-05 DIAGNOSIS — S20219A Contusion of unspecified front wall of thorax, initial encounter: Secondary | ICD-10-CM

## 2020-09-05 DIAGNOSIS — S0083XA Contusion of other part of head, initial encounter: Secondary | ICD-10-CM | POA: Diagnosis not present

## 2020-09-05 DIAGNOSIS — F17200 Nicotine dependence, unspecified, uncomplicated: Secondary | ICD-10-CM | POA: Insufficient documentation

## 2020-09-05 DIAGNOSIS — E876 Hypokalemia: Secondary | ICD-10-CM

## 2020-09-05 DIAGNOSIS — S0990XA Unspecified injury of head, initial encounter: Secondary | ICD-10-CM | POA: Diagnosis present

## 2020-09-05 DIAGNOSIS — S20229A Contusion of unspecified back wall of thorax, initial encounter: Secondary | ICD-10-CM

## 2020-09-05 LAB — BASIC METABOLIC PANEL
Anion gap: 11 (ref 5–15)
BUN: 13 mg/dL (ref 6–20)
CO2: 23 mmol/L (ref 22–32)
Calcium: 9 mg/dL (ref 8.9–10.3)
Chloride: 102 mmol/L (ref 98–111)
Creatinine, Ser: 0.76 mg/dL (ref 0.44–1.00)
GFR, Estimated: >60 mL/min (ref >60)
Glucose, Bld: 95 mg/dL (ref 70–99)
Potassium: 3.3 mmol/L — ABNORMAL LOW (ref 3.5–5.1)
Sodium: 136 mmol/L (ref 135–145)

## 2020-09-05 LAB — I-STAT BETA HCG BLOOD, ED (MC, WL, AP ONLY): I-stat hCG, quantitative: <5 m[IU]/mL (ref <5)

## 2020-09-05 LAB — CBC
HCT: 35.1 % — ABNORMAL LOW (ref 36.0–46.0)
Hemoglobin: 10.8 g/dL — ABNORMAL LOW (ref 12.0–15.0)
MCH: 26.9 pg (ref 26.0–34.0)
MCHC: 30.8 g/dL (ref 30.0–36.0)
MCV: 87.3 fL (ref 80.0–100.0)
Platelets: 438 10*3/uL — ABNORMAL HIGH (ref 150–400)
RBC: 4.02 MIL/uL (ref 3.87–5.11)
RDW: 14.4 % (ref 11.5–15.5)
WBC: 8 10*3/uL (ref 4.0–10.5)
nRBC: 0 % (ref 0.0–0.2)

## 2020-09-05 LAB — TROPONIN I (HIGH SENSITIVITY): Troponin I (High Sensitivity): 2 ng/L (ref <18)

## 2020-09-05 MED ORDER — ACETAMINOPHEN 500 MG PO TABS
1000.0000 mg | ORAL_TABLET | Freq: Once | ORAL | Status: AC
  Administered 2020-09-05: 1000 mg via ORAL
  Filled 2020-09-05: qty 2

## 2020-09-05 MED ORDER — POTASSIUM CHLORIDE CRYS ER 20 MEQ PO TBCR
40.0000 meq | EXTENDED_RELEASE_TABLET | Freq: Once | ORAL | Status: AC
  Administered 2020-09-05: 40 meq via ORAL
  Filled 2020-09-05: qty 2

## 2020-09-05 NOTE — ED Notes (Signed)
Patient verbalizes understanding of discharge instructions. Opportunity for questioning and answers were provided. Armband removed by staff, pt discharged from ED ambulatory to home.  
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
## 2020-09-05 NOTE — Discharge Instructions (Addendum)
It was our pleasure to provide your ER care today - we hope that you feel better.  Take acetaminophen or ibuprofen as need.   From today's labs, your potassium level is mildly low - eat plenty of fruits and vegetables, and follow up with primary care doctor in 1-2 weeks.   Return to ER if worse, new or severe pain, trouble breathing, or other concern.

## 2021-03-10 IMAGING — CR DG CHEST 2V
2 series · 2 of 2 positions shown · non-contrast
Comparison: 11/05/2007

CLINICAL DATA: Chest pain.

EXAM:
CHEST - 2 VIEW

[chest pa]
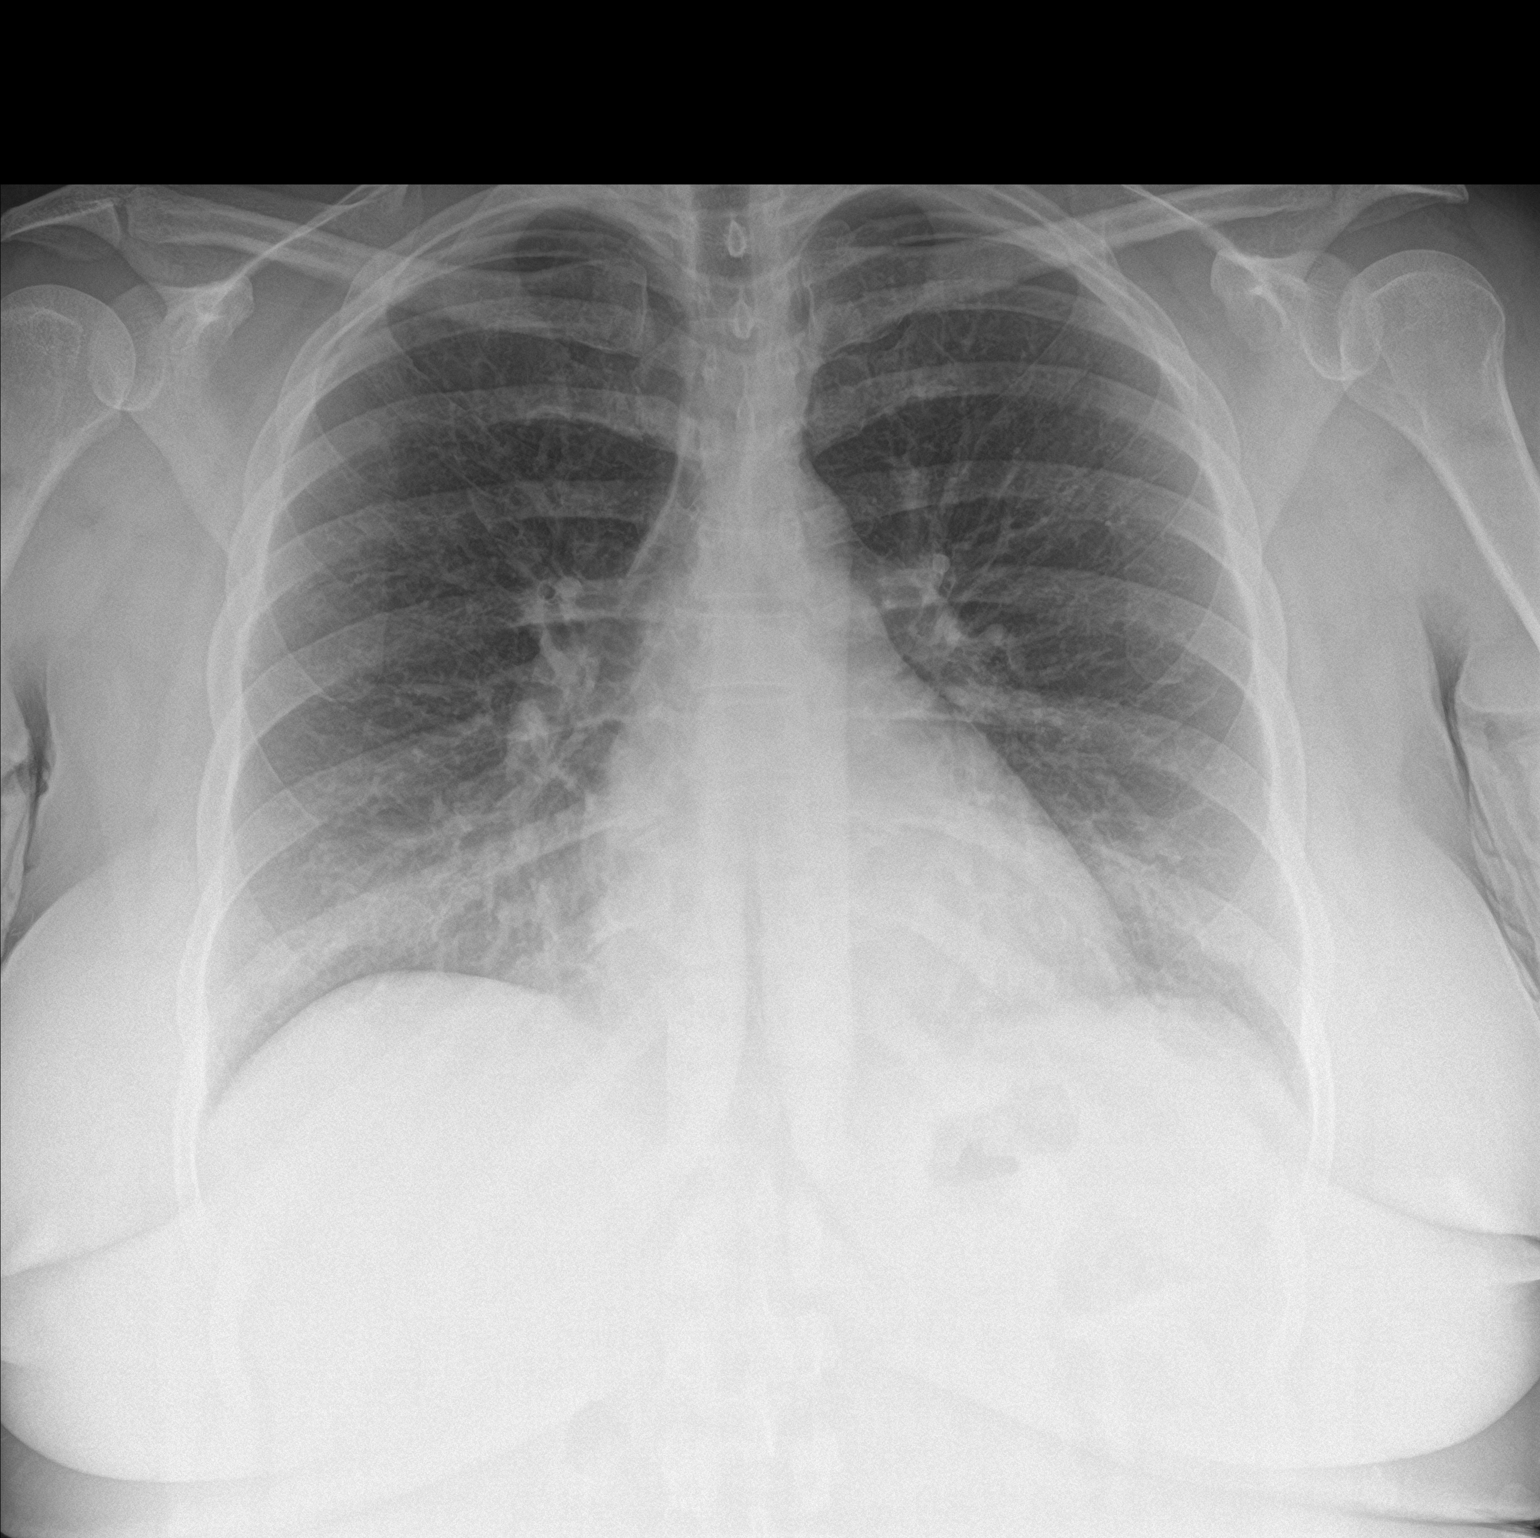

[chest lat]
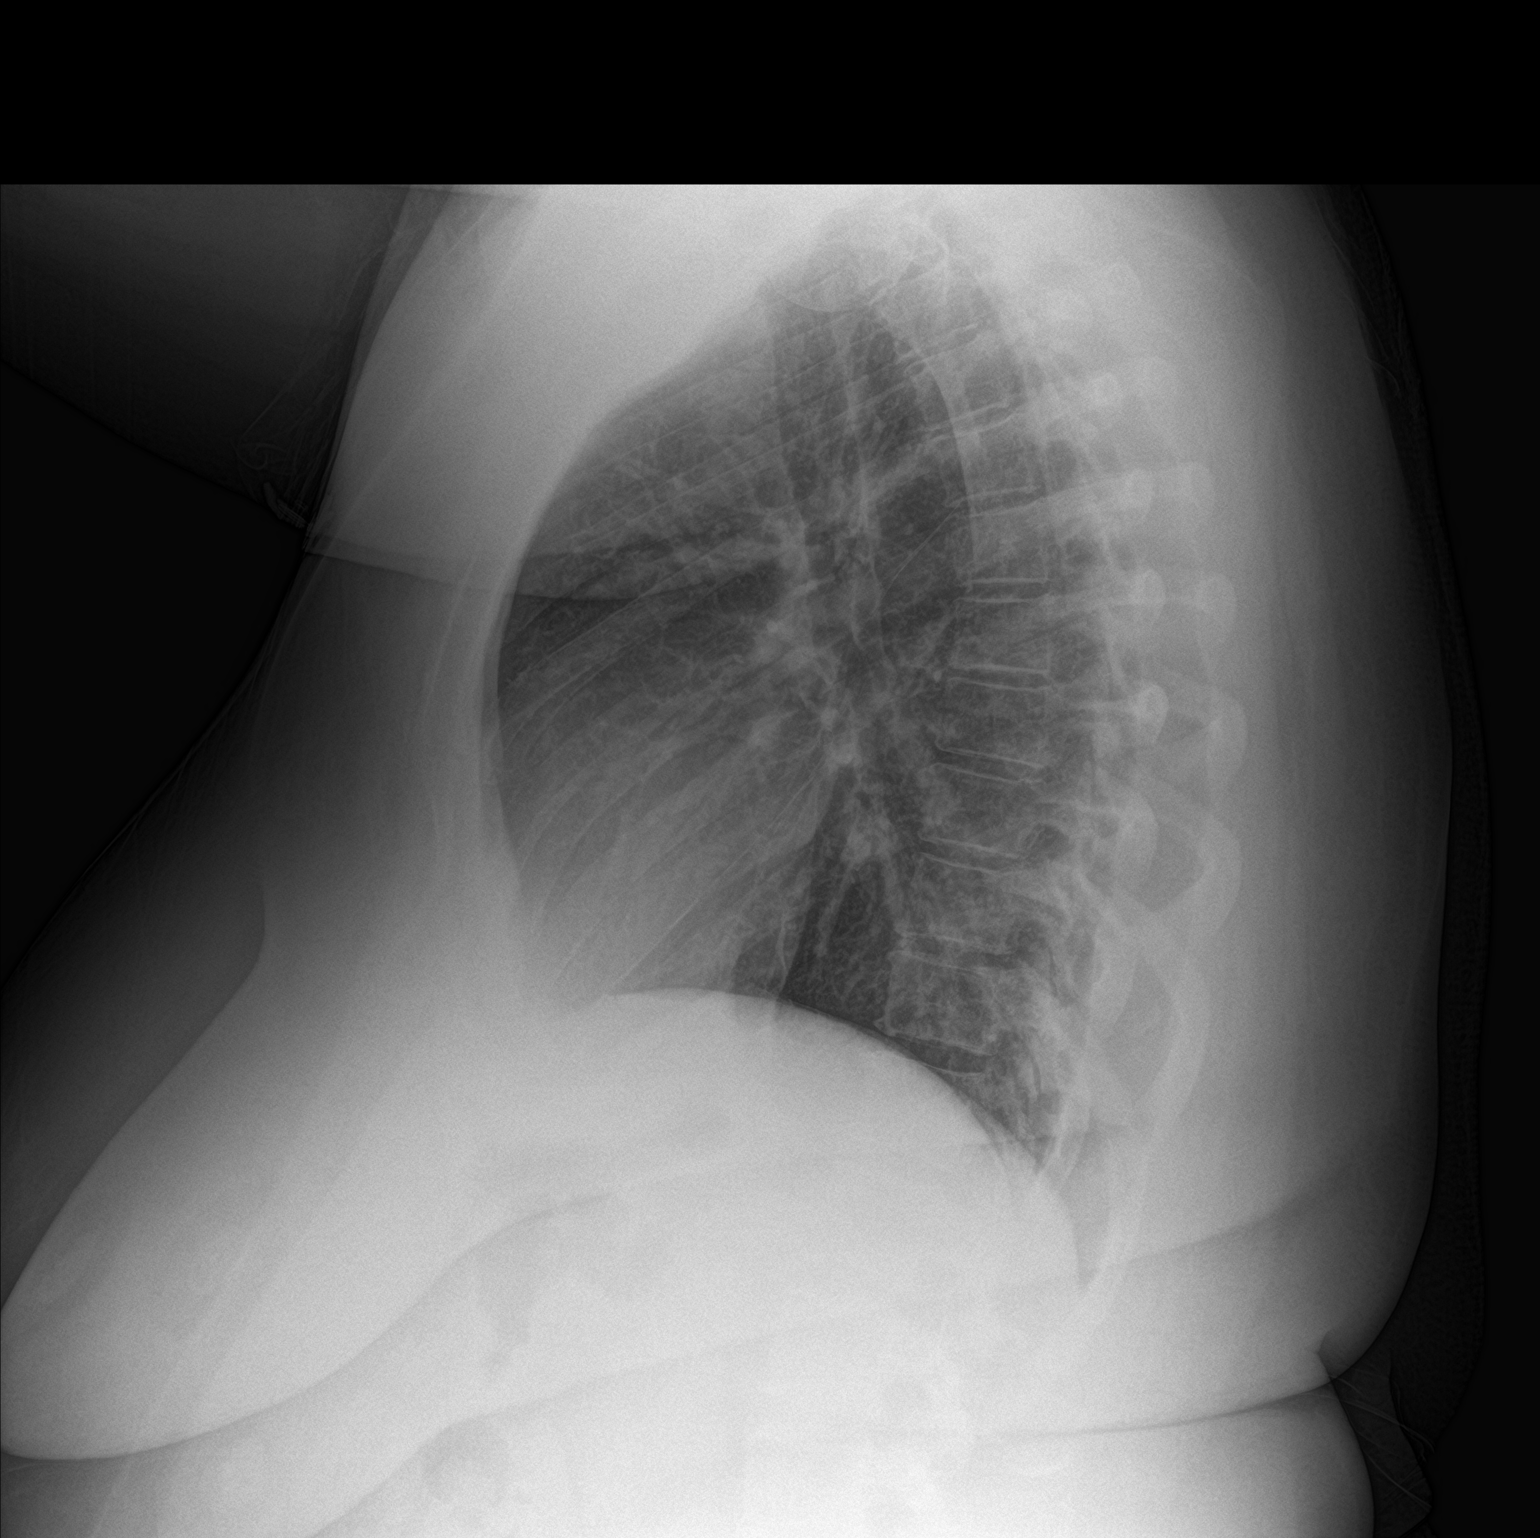

[2 of 2 positions shown; findings below may reference images not displayed]

FINDINGS: The heart size and mediastinal contours are within normal limits.
Both lungs are clear. No pleural effusions or pneumothorax. The
visualized skeletal structures are unremarkable.
IMPRESSION: No acute cardiopulmonary disease.
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
## 2022-07-20 ENCOUNTER — Other Ambulatory Visit: Payer: Self-pay

## 2022-07-20 ENCOUNTER — Emergency Department (HOSPITAL_BASED_OUTPATIENT_CLINIC_OR_DEPARTMENT_OTHER)
Admission: EM | Admit: 2022-07-20 | Discharge: 2022-07-20 | Disposition: A | Discharge status: Home / Self Care | Payer: Medicaid Other | Attending: Emergency Medicine | Admitting: Emergency Medicine

## 2022-07-20 ENCOUNTER — Encounter (HOSPITAL_BASED_OUTPATIENT_CLINIC_OR_DEPARTMENT_OTHER): Payer: Self-pay | Admitting: Emergency Medicine

## 2022-07-20 DIAGNOSIS — M549 Dorsalgia, unspecified: Secondary | ICD-10-CM | POA: Diagnosis present

## 2022-07-20 DIAGNOSIS — M62830 Muscle spasm of back: Secondary | ICD-10-CM | POA: Diagnosis not present

## 2022-07-20 LAB — CBC WITH DIFFERENTIAL/PLATELET
Abs Immature Granulocytes: 0.01 10*3/uL (ref 0.00–0.07)
Basophils Absolute: 0 10*3/uL (ref 0.0–0.1)
Basophils Relative: 0 %
Eosinophils Absolute: 0.2 10*3/uL (ref 0.0–0.5)
Eosinophils Relative: 3 %
HCT: 35.9 % — ABNORMAL LOW (ref 36.0–46.0)
Hemoglobin: 11.5 g/dL — ABNORMAL LOW (ref 12.0–15.0)
Immature Granulocytes: 0 %
Lymphocytes Relative: 37 %
Lymphs Abs: 2.3 10*3/uL (ref 0.7–4.0)
MCH: 26.7 pg (ref 26.0–34.0)
MCHC: 32 g/dL (ref 30.0–36.0)
MCV: 83.5 fL (ref 80.0–100.0)
Monocytes Absolute: 0.6 10*3/uL (ref 0.1–1.0)
Monocytes Relative: 9 %
Neutro Abs: 3.2 10*3/uL (ref 1.7–7.7)
Neutrophils Relative %: 51 %
Platelets: 572 10*3/uL — ABNORMAL HIGH (ref 150–400)
RBC: 4.3 MIL/uL (ref 3.87–5.11)
RDW: 16.1 % — ABNORMAL HIGH (ref 11.5–15.5)
WBC: 6.3 10*3/uL (ref 4.0–10.5)
nRBC: 0 % (ref 0.0–0.2)

## 2022-07-20 MED ORDER — CYCLOBENZAPRINE HCL 10 MG PO TABS: 10.0000 mg | ORAL_TABLET | Freq: Two times a day (BID) | ORAL | 0 refills | Status: AC | PRN

## 2022-07-20 MED ORDER — DIAZEPAM 5 MG/ML IJ SOLN
5.0000 mg | Freq: Once | INTRAMUSCULAR | Status: AC
  Administered 2022-07-20: 5 mg via INTRAVENOUS
  Filled 2022-07-20: qty 2

## 2022-07-20 MED ORDER — METHYLPREDNISOLONE 4 MG PO TBPK: ORAL_TABLET | ORAL | 0 refills | Status: AC

## 2022-07-20 NOTE — ED Notes (Signed)
Pt given 1/2 cup of ice chips ok per RN Ranette. Pt requested a ice pack to place on her head. Pt given the same.

## 2022-07-20 NOTE — ED Provider Notes (Signed)
[facility] EMERGENCY DEPARTMENT Provider Note   CSN: [id number] Arrival date & time: 07/20/22  1957     History  Chief Complaint  Patient presents with   Back Pain    [name] is a 41 y.o. female.  Patient here with back spasm.  History of the same.  Nothing makes it worse or better.  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  She states that upper back neck muscles have been way more tight tonight than normal.  Denies any weakness or numbness or chills.  Denies any cough or sputum production.  No chest pain or shortness of breath or abdominal pain.  She states that this is been happening for months.  The history is provided by the patient.       Home Medications Prior to Admission medications   Medication Sig Start Date End Date Taking? Authorizing Provider  cyclobenzaprine (FLEXERIL) 10 MG tablet Take 1 tablet (10 mg total) by mouth 2 (two) times daily as needed for muscle spasms. 07/20/22  Yes Kalev Temme, DO  methylPREDNISolone (MEDROL DOSEPAK) 4 MG TBPK tablet Follow package insert 07/20/22  Yes Latrease Kunde, DO  acetaminophen (TYLENOL) 325 MG tablet Take 650 mg by mouth every 6 (six) hours as needed.    [provider]  amoxicillin-clavulanate (AUGMENTIN) 875-125 MG tablet Take 1 tablet by mouth every 12 (twelve) hours. 10/16/18   Renne Crigler, PA-C  ibuprofen (ADVIL,MOTRIN) 600 MG tablet Take 600 mg by mouth every 6 (six) hours as needed.    [provider]      Allergies    Patient has no known allergies.    Review of Systems   Review of Systems  Physical Exam Updated Vital Signs BP (!) 159/124 (BP Location: Left Arm)   Pulse 91   Temp 98.2 F (36.8 C)   Resp (!) 24   Ht 5\' 4"  (1.626 m)   Wt 117.9 kg   LMP 07/06/2022 (Approximate)   SpO2 99%   BMI 44.63 kg/m  Physical Exam Vitals and nursing note reviewed.  Constitutional:      General: She is not in acute distress.    Appearance: She is well-developed. She  is not ill-appearing.  HENT:     Head: Normocephalic and atraumatic.     Mouth/Throat:     Mouth: Mucous membranes are moist.  Eyes:     Extraocular Movements: Extraocular movements intact.     Conjunctiva/sclera: Conjunctivae normal.     Pupils: Pupils are equal, round, and reactive to light.  Cardiovascular:     Rate and Rhythm: Normal rate and regular rhythm.     Pulses: Normal pulses.     Heart sounds: Normal heart sounds. No murmur heard. Pulmonary:     Effort: Pulmonary effort is normal. No respiratory distress.     Breath sounds: Normal breath sounds.  Abdominal:     Palpations: Abdomen is soft.     Tenderness: There is no abdominal tenderness.  Musculoskeletal:        General: Tenderness present. No swelling.     Cervical back: Normal range of motion and neck supple. Tenderness present.     Comments: Tenderness to the paraspinal  cervical muscles bilaterally into the trapezius and upper back muscles bilaterally.  No midline spinal tenderness.  Skin:    General: Skin is warm and dry.     Capillary Refill: Capillary refill takes less than 2 seconds.  Neurological:     General:  No focal deficit present.     Mental Status: She is alert.     Cranial Nerves: No cranial nerve deficit.     Sensory: No sensory deficit.     Motor: No weakness.     Coordination: Coordination normal.     Comments: 5+ out of 5 strength throughout, normal sensation, no drift, normal finger-nose-finger, normal speech  Psychiatric:        Mood and Affect: Mood normal.     ED Results / Procedures / Treatments   Labs (all labs ordered are listed, but only abnormal results are displayed) Labs Reviewed  CBC WITH DIFFERENTIAL/PLATELET - Abnormal; Notable for the following components:      Result Value   Hemoglobin 11.5 (*)    HCT 35.9 (*)    RDW 16.1 (*)    Platelets 572 (*)    All other components within normal limits    EKG None  Radiology No results found.  Procedures Procedures     Medications Ordered in ED Medications  diazepam (VALIUM) injection 5 mg (5 mg Intravenous Given 07/20/22 2202)    ED Course/ Medical Decision Making/ A&P                           Medical Decision Making Amount and/or Complexity of Data Reviewed Labs: ordered.  Risk Prescription drug management.   [name] is here with upper back pain.  Unremarkable vitals.  No fever.  Tenderness to the paraspinal cervical muscles bilaterally and into the trapezius muscles.  No midline spinal tenderness.  Normal range of motion of neck.  She has normal strength and sensation in her upper extremities.  Her neurological exam is normal.  Overall differential diagnosis that this is a muscle spasm.  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  She was given a dose of IM Valium with great improvement.  Blood works unremarkable.  She denies any concern about being pregnant.  Will prescribe Medrol Dosepak and Flexeril and have her follow-up with sports medicine.  I am not concerned about any acute neurological vascular process.  Discharged in good condition.  This chart was dictated using voice recognition software.  Despite best efforts to proofread,  errors can occur which can change the documentation meaning.         Final Clinical Impression(s) / ED Diagnoses Final diagnoses:  Back spasm    Rx / DC Orders ED Discharge Orders          Ordered    cyclobenzaprine (FLEXERIL) 10 MG tablet  2 times daily PRN        07/20/22 2226    methylPREDNISolone (MEDROL DOSEPAK) 4 MG TBPK tablet        07/20/22 2226              Virgina Norfolk, DO 07/20/22 2229

## 2022-07-20 NOTE — ED Notes (Signed)
Pt crying out in pain, difficult to assess, jerking body movements.  States she can not take this pain anymore, has been dealing with it for days and feels "something is wrong with my body." States her lower back hurts and her neck draws to the right when pain approaches

## 2022-07-20 NOTE — ED Triage Notes (Signed)
Patient arrived via POV c/o upper back and neck pain x today. Patient states being seen by provider for multiple complaints, including same. Patient states feeling back and neck pulling tight across head. Patient states 10/10 pain. Patient is AO x 4, VS w/ elevated BP, normal gait.

## 2023-01-22 ENCOUNTER — Emergency Department (HOSPITAL_COMMUNITY): Payer: 59

## 2023-01-22 ENCOUNTER — Emergency Department (HOSPITAL_COMMUNITY)
Admission: EM | Admit: 2023-01-22 | Discharge: 2023-01-22 | Disposition: A | Discharge status: Home / Self Care | Payer: 59 | Attending: Emergency Medicine | Admitting: Emergency Medicine

## 2023-01-22 ENCOUNTER — Other Ambulatory Visit: Payer: Self-pay

## 2023-01-22 DIAGNOSIS — R519 Headache, unspecified: Secondary | ICD-10-CM | POA: Insufficient documentation

## 2023-01-22 DIAGNOSIS — R231 Pallor: Secondary | ICD-10-CM | POA: Diagnosis not present

## 2023-01-22 DIAGNOSIS — G4489 Other headache syndrome: Secondary | ICD-10-CM | POA: Diagnosis not present

## 2023-01-22 DIAGNOSIS — R531 Weakness: Secondary | ICD-10-CM | POA: Diagnosis not present

## 2023-01-22 DIAGNOSIS — R4589 Other symptoms and signs involving emotional state: Secondary | ICD-10-CM | POA: Diagnosis not present

## 2023-01-22 DIAGNOSIS — Z743 Need for continuous supervision: Secondary | ICD-10-CM | POA: Diagnosis not present

## 2023-01-22 LAB — CBC WITH DIFFERENTIAL/PLATELET
Abs Immature Granulocytes: 0.02 10*3/uL (ref 0.00–0.07)
Basophils Absolute: 0 10*3/uL (ref 0.0–0.1)
Basophils Relative: 0 %
Eosinophils Absolute: 0.1 10*3/uL (ref 0.0–0.5)
Eosinophils Relative: 2 %
HCT: 33.3 % — ABNORMAL LOW (ref 36.0–46.0)
Hemoglobin: 10.1 g/dL — ABNORMAL LOW (ref 12.0–15.0)
Immature Granulocytes: 0 %
Lymphocytes Relative: 13 %
Lymphs Abs: 0.9 10*3/uL (ref 0.7–4.0)
MCH: 25.8 pg — ABNORMAL LOW (ref 26.0–34.0)
MCHC: 30.3 g/dL (ref 30.0–36.0)
MCV: 85.2 fL (ref 80.0–100.0)
Monocytes Absolute: 0.4 10*3/uL (ref 0.1–1.0)
Monocytes Relative: 6 %
Neutro Abs: 5.7 10*3/uL (ref 1.7–7.7)
Neutrophils Relative %: 79 %
Platelets: 475 10*3/uL — ABNORMAL HIGH (ref 150–400)
RBC: 3.91 MIL/uL (ref 3.87–5.11)
RDW: 16.7 % — ABNORMAL HIGH (ref 11.5–15.5)
WBC: 7.2 10*3/uL (ref 4.0–10.5)
nRBC: 0 % (ref 0.0–0.2)

## 2023-01-22 LAB — BASIC METABOLIC PANEL
Anion gap: 9 (ref 5–15)
BUN: 15 mg/dL (ref 6–20)
CO2: 26 mmol/L (ref 22–32)
Calcium: 8.7 mg/dL — ABNORMAL LOW (ref 8.9–10.3)
Chloride: 106 mmol/L (ref 98–111)
Creatinine, Ser: 0.71 mg/dL (ref 0.44–1.00)
GFR, Estimated: >60 mL/min (ref >60)
Glucose, Bld: 106 mg/dL — ABNORMAL HIGH (ref 70–99)
Potassium: 3.3 mmol/L — ABNORMAL LOW (ref 3.5–5.1)
Sodium: 141 mmol/L (ref 135–145)

## 2023-01-22 MED ORDER — METOCLOPRAMIDE HCL 5 MG/ML IJ SOLN
10.0000 mg | Freq: Once | INTRAMUSCULAR | Status: AC
  Administered 2023-01-22: 10 mg via INTRAVENOUS
  Filled 2023-01-22: qty 2

## 2023-01-22 MED ORDER — AMOXICILLIN-POT CLAVULANATE 875-125 MG PO TABS: 1.0000 | ORAL_TABLET | Freq: Two times a day (BID) | ORAL | 0 refills | Status: AC

## 2023-01-22 MED ORDER — DEXAMETHASONE SODIUM PHOSPHATE 10 MG/ML IJ SOLN
10.0000 mg | Freq: Once | INTRAMUSCULAR | Status: AC
  Administered 2023-01-22: 10 mg via INTRAVENOUS
  Filled 2023-01-22: qty 1

## 2023-01-22 MED ORDER — SODIUM CHLORIDE 0.9 % IV BOLUS
500.0000 mL | Freq: Once | INTRAVENOUS | Status: AC
  Administered 2023-01-22: 500 mL via INTRAVENOUS

## 2023-01-22 NOTE — ED Triage Notes (Addendum)
Pt BIBA from home. Pt c/o severe headache w/photosensitivity  beginning at 1800. Pt reports they took 50 mg of benadryl before EMS arrival. Pt somnolent, but arousable to voice. EMS gave '650mg'$  tylenol.  Pt reports pain relieved somewhat.  GCS 14 on arrival

## 2023-01-22 NOTE — ED Provider Notes (Signed)
[place] EMERGENCY DEPARTMENT AT [facility] Provider Note   CSN: [id number] Arrival date & time: 01/22/23  1836     History  Chief Complaint  Patient presents with   Headache    [name] is a 42 y.o. female.  She is here with acute onset of generalized headache that started about 2 hours prior to arrival.  She has had severe and associated with nausea.  She has light sensitivity.  She said her head feels congested.  She denies any trauma or fever.  She has tried nothing for it.  She took Benadryl prior to arrival and EMS also gave her some Tylenol.  The history is provided by the patient.  Headache Pain location:  Generalized Quality:  Dull Radiates to:  Does not radiate Severity currently:  10/10 Severity at highest:  10/10 Onset quality:  Sudden Duration:  2 hours Timing:  Constant Progression:  Unchanged Chronicity:  New Similar to prior headaches: no   Relieved by:  Nothing Worsened by:  Light Ineffective treatments:  Resting in a darkened room Associated symptoms: nausea and photophobia   Associated symptoms: no abdominal pain, no eye pain, no fever, no focal weakness, no neck pain and no vomiting        Home Medications Prior to Admission medications   Medication Sig Start Date End Date Taking? Authorizing Provider  acetaminophen (TYLENOL) 325 MG tablet Take 650 mg by mouth every 6 (six) hours as needed.    [provider]  amoxicillin-clavulanate (AUGMENTIN) 875-125 MG tablet Take 1 tablet by mouth every 12 (twelve) hours. 10/16/18   Renne Crigler, PA-C  cyclobenzaprine (FLEXERIL) 10 MG tablet Take 1 tablet (10 mg total) by mouth 2 (two) times daily as needed for muscle spasms. 07/20/22   Curatolo, Adam, DO  ibuprofen (ADVIL,MOTRIN) 600 MG tablet Take 600 mg by mouth every 6 (six) hours as needed.    [provider]  methylPREDNISolone (MEDROL DOSEPAK) 4 MG TBPK tablet Follow package insert 07/20/22   Curatolo, Adam, DO       Allergies    Patient has no known allergies.    Review of Systems   Review of Systems  Constitutional:  Negative for fever.  Eyes:  Positive for photophobia. Negative for pain.  Gastrointestinal:  Positive for nausea. Negative for abdominal pain and vomiting.  Musculoskeletal:  Negative for neck pain.  Neurological:  Positive for headaches. Negative for focal weakness.    Physical Exam Updated Vital Signs BP (!) 213/143 (BP Location: Left Arm)   Pulse 84   Temp 98.2 F (36.8 C) (Oral)   Resp 16   Wt 117.9 kg   SpO2 100%   BMI 44.63 kg/m  Physical Exam Vitals and nursing note reviewed.  Constitutional:      General: She is not in acute distress.    Appearance: She is well-developed.  HENT:     Head: Normocephalic and atraumatic.  Eyes:     Conjunctiva/sclera: Conjunctivae normal.  Cardiovascular:     Rate and Rhythm: Normal rate and regular rhythm.     Heart sounds: No murmur heard. Pulmonary:     Effort: Pulmonary effort is normal. No respiratory distress.     Breath sounds: Normal breath sounds.  Abdominal:     Palpations: Abdomen is soft.     Tenderness: There is no abdominal tenderness.  Musculoskeletal:        General: No swelling.     Cervical back: Neck supple.  Skin:  General: Skin is warm and dry.     Capillary Refill: Capillary refill takes less than 2 seconds.  Neurological:     Mental Status: She is alert and oriented to person, place, and time.     GCS: GCS eye subscore is 4. GCS verbal subscore is 5. GCS motor subscore is 6.     Cranial Nerves: No cranial nerve deficit.     Sensory: No sensory deficit.     Motor: No weakness.     ED Results / Procedures / Treatments   Labs (all labs ordered are listed, but only abnormal results are displayed) Labs Reviewed - No data to display  EKG None  Radiology No results found.  Procedures Procedures    Medications Ordered in ED Medications  metoCLOPramide (REGLAN) injection 10 mg (has no  administration in time range)  sodium chloride 0.9 % bolus 500 mL (has no administration in time range)  dexamethasone (DECADRON) injection 10 mg (has no administration in time range)    ED Course/ Medical Decision Making/ A&P Clinical Course as of 01/23/23 0911  Sat Jan 22, 2023  2131 Patient states her she is feeling much better after medication.  Blood pressure is also much improved.  She is not surprised to hear that it might be some sinuses going on.  She is comfortable plan for discharge and will put her on some antibiotics. [MB]    Clinical Course User Index [MB] Terrilee Files, MD                             Medical Decision Making Amount and/or Complexity of Data Reviewed Labs: ordered. Radiology: ordered.  Risk Prescription drug management.   This patient complains of severe general headache with photophobia nasal congestion; this involves an extensive number of treatment Options and is a complaint that carries with it a high risk of complications and morbidity. The differential includes headache, migraine, subarachnoid, sinusitis  I ordered, reviewed and interpreted labs, which included CBC with normal white count, hemoglobin stable from priors, chemistries mildly low potassium I ordered medication migraine cocktail with improvement in her symptoms and reviewed PMP when indicated. I ordered imaging studies which included head CT and I independently    visualized and interpreted imaging which showed no subarachnoid, does have significant sinus disease Previous records obtained and reviewed in epic no recent admissions Cardiac monitoring reviewed, normal sinus rhythm Social determinants considered, ongoing tobacco use Critical Interventions: None  After the interventions stated above, I reevaluated the patient and found patient's symptoms to be much improved Admission and further testing considered, no indications for admission or further workup at this time.  Will  cover with antibiotics for sinusitis and return instructions discussed.         Final Clinical Impression(s) / ED Diagnoses Final diagnoses:  Sinus headache    Rx / DC Orders ED Discharge Orders          Ordered    amoxicillin-clavulanate (AUGMENTIN) 875-125 MG tablet  Every 12 hours        01/22/23 2132              Terrilee Files, MD 01/23/23 [phone]

## 2023-01-26 ENCOUNTER — Telehealth: Payer: 59 | Admitting: Family

## 2023-01-26 DIAGNOSIS — T3695XA Adverse effect of unspecified systemic antibiotic, initial encounter: Secondary | ICD-10-CM

## 2023-01-26 DIAGNOSIS — B379 Candidiasis, unspecified: Secondary | ICD-10-CM

## 2023-01-26 MED ORDER — FLUCONAZOLE 150 MG PO TABS: 150.0000 mg | ORAL_TABLET | ORAL | 0 refills | Status: AC | PRN

## 2023-01-26 NOTE — Progress Notes (Signed)
E-Visit for Vaginal Symptoms ? ?We are sorry that you are not feeling well. Here is how we plan to help! ?Based on what you shared with me it looks like you: May have a yeast vaginosis ? ?Vaginosis is an inflammation of the vagina that can result in discharge, itching and pain. The cause is usually a change in the normal balance of vaginal bacteria or an infection. Vaginosis can also result from reduced estrogen levels after menopause. ? ?The most common causes of vaginosis are: ? ? Bacterial vaginosis which results from an overgrowth of one on several organisms that are normally present in your vagina. ? ? Yeast infections which are caused by a naturally occurring fungus called candida. ? ? Vaginal atrophy (atrophic vaginosis) which results from the thinning of the vagina from reduced estrogen levels after menopause. ? ? Trichomoniasis which is caused by a parasite and is commonly transmitted by sexual intercourse. ? ?Factors that increase your risk of developing vaginosis include: ?Medications, such as antibiotics and steroids ?Uncontrolled diabetes ?Use of hygiene products such as bubble bath, vaginal spray or vaginal deodorant ?Douching ?Wearing damp or tight-fitting clothing ?Using an intrauterine device (IUD) for birth control ?Hormonal changes, such as those associated with pregnancy, birth control pills or menopause ?Sexual activity ?Having a sexually transmitted infection ? ?Your treatment plan is A single Diflucan (fluconazole) 150mg tablet once.  I have electronically sent this prescription into the pharmacy that you have chosen. ? ?Be sure to take all of the medication as directed. Stop taking any medication if you develop a rash, tongue swelling or shortness of breath. Mothers who are breast feeding should consider pumping and discarding their breast milk while on these antibiotics. However, there is no consensus that infant exposure at these doses would be harmful.  ?Remember that medication creams can  weaken latex condoms. ?. ? ? ?HOME CARE: ? ?Good hygiene may prevent some types of vaginosis from recurring and may relieve some symptoms: ? ?Avoid baths, hot tubs and whirlpool spas. Rinse soap from your outer genital area after a shower, and dry the area well to prevent irritation. Don't use scented or harsh soaps, such as those with deodorant or antibacterial action. ?Avoid irritants. These include scented tampons and pads. ?Wipe from front to back after using the toilet. Doing so avoids spreading fecal bacteria to your vagina. ? ?Other things that may help prevent vaginosis include: ? ?Don't douche. Your vagina doesn't require cleansing other than normal bathing. Repetitive douching disrupts the normal organisms that reside in the vagina and can actually increase your risk of vaginal infection. Douching won't clear up a vaginal infection. ?Use a latex condom. Both female and female latex condoms may help you avoid infections spread by sexual contact. ?Wear cotton underwear. Also wear pantyhose with a cotton crotch. If you feel comfortable without it, skip wearing underwear to bed. Yeast thrives in moist environments ?Your symptoms should improve in the next day or two. ? ?GET HELP RIGHT AWAY IF: ? ?You have pain in your lower abdomen ( pelvic area or over your ovaries) ?You develop nausea or vomiting ?You develop a fever ?Your discharge changes or worsens ?You have persistent pain with intercourse ?You develop shortness of breath, a rapid pulse, or you faint. ? ?These symptoms could be signs of problems or infections that need to be evaluated by a medical provider now. ? ?MAKE SURE YOU  ? ?Understand these instructions. ?Will watch your condition. ?Will get help right away if you are not   doing well or get worse. ? ?Thank you for choosing an e-visit. ? ?Your e-visit answers were reviewed by a board certified advanced clinical practitioner to complete your personal care plan. Depending upon the condition, your plan  could have included both over the counter or prescription medications. ? ?Please review your pharmacy choice. Make sure the pharmacy is open so you can pick up prescription now. If there is a problem, you may contact your provider through MyChart messaging and have the prescription routed to another pharmacy.  Your safety is important to us. If you have drug allergies check your prescription carefully.  ? ?For the next 24 hours you can use MyChart to ask questions about today's visit, request a non-urgent call back, or ask for a work or school excuse. ?You will get an email in the next two days asking about your experience. I hope that your e-visit has been valuable and will speed your recovery. ? ?Approximately 5 minutes was spent documenting and reviewing patient's chart. ? ?

## 2023-07-04 ENCOUNTER — Ambulatory Visit
Admission: EM | Admit: 2023-07-04 | Discharge: 2023-07-04 | Disposition: A | Discharge status: Home / Self Care | Payer: 59 | Attending: Internal Medicine | Admitting: Internal Medicine

## 2023-07-04 DIAGNOSIS — R1030 Lower abdominal pain, unspecified: Secondary | ICD-10-CM | POA: Diagnosis not present

## 2023-07-04 DIAGNOSIS — S3991XA Unspecified injury of abdomen, initial encounter: Secondary | ICD-10-CM

## 2023-07-04 LAB — POCT URINALYSIS DIP (MANUAL ENTRY)
Bilirubin, UA: NEGATIVE
Blood, UA: NEGATIVE
Glucose, UA: NEGATIVE mg/dL
Ketones, POC UA: NEGATIVE mg/dL
Leukocytes, UA: NEGATIVE
Nitrite, UA: NEGATIVE
Protein Ur, POC: NEGATIVE mg/dL
Spec Grav, UA: >=1.03 — AB (ref 1.010–1.025)
Urobilinogen, UA: 0.2 E.U./dL
pH, UA: 6.5 (ref 5.0–8.0)

## 2023-07-04 LAB — POCT URINE PREGNANCY: Preg Test, Ur: NEGATIVE
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
## 2023-07-04 NOTE — Discharge Instructions (Addendum)
Please go to the emergency room for further evaluation of your abdominal pain 

## 2023-12-01 ENCOUNTER — Other Ambulatory Visit: Payer: Self-pay

## 2023-12-01 ENCOUNTER — Emergency Department (HOSPITAL_BASED_OUTPATIENT_CLINIC_OR_DEPARTMENT_OTHER)
Admission: EM | Admit: 2023-12-01 | Discharge: 2023-12-01 | Disposition: A | Discharge status: Home / Self Care | Payer: 59

## 2023-12-01 DIAGNOSIS — T161XXA Foreign body in right ear, initial encounter: Secondary | ICD-10-CM | POA: Diagnosis not present

## 2023-12-01 DIAGNOSIS — X58XXXA Exposure to other specified factors, initial encounter: Secondary | ICD-10-CM | POA: Diagnosis not present

## 2023-12-01 MED ORDER — IBUPROFEN 400 MG PO TABS
600.0000 mg | ORAL_TABLET | Freq: Once | ORAL | Status: AC
  Administered 2023-12-01: 600 mg via ORAL
  Filled 2023-12-01: qty 1

## 2023-12-01 NOTE — ED Provider Notes (Signed)
[place] [facility] AT [facility] Provider Note   CSN: [id number] Arrival date & time: 12/01/23  1148     History  Chief Complaint  Patient presents with   Otalgia    Left ear      [name] is a 43 y.o. female.  43 year old female with no reported past medical history presenting to the emergency department today with pain in her left ear.  The patient states that she was trying to clean out her ear with tissue paper yesterday.  She states that this is now stuck in her left ear.  She states that she is having pain in the area.  She came to the ER today to have this removed.   Otalgia      Home Medications Prior to Admission medications   Medication Sig Start Date End Date Taking? Authorizing Provider  acetaminophen (TYLENOL) 325 MG tablet Take 650 mg by mouth every 6 (six) hours as needed.    [provider]  amoxicillin-clavulanate (AUGMENTIN) 875-125 MG tablet Take 1 tablet by mouth every 12 (twelve) hours. 01/22/23   Terrilee Files, MD  cyclobenzaprine (FLEXERIL) 10 MG tablet Take 1 tablet (10 mg total) by mouth 2 (two) times daily as needed for muscle spasms. 07/20/22   Curatolo, Adam, DO  fluconazole (DIFLUCAN) 150 MG tablet Take 1 tablet (150 mg total) by mouth every three (3) days as needed. 01/26/23   Junie Spencer, FNP  ibuprofen (ADVIL,MOTRIN) 600 MG tablet Take 600 mg by mouth every 6 (six) hours as needed.    [provider]  methylPREDNISolone (MEDROL DOSEPAK) 4 MG TBPK tablet Follow package insert 07/20/22   Curatolo, Adam, DO      Allergies    Patient has no known allergies.    Review of Systems   Review of Systems  HENT:  Positive for ear pain.   All other systems reviewed and are negative.   Physical Exam Updated Vital Signs BP (!) 121/97 (BP Location: Left Arm)   Pulse 93   Temp 98.3 F (36.8 C)   Resp 16   Ht 5\' 4"  (1.626 m)   Wt 113.4 kg   LMP 11/27/2023 (Exact Date)   SpO2 98%   BMI 42.91  kg/m  Physical Exam Vitals and nursing note reviewed.   Gen: NAD Eyes: PERRL, EOMI HEENT: no oropharyngeal swelling, there is appear to be tissue paper noted deep in her left ear that is abutting the TM with some surrounding erythema Psyc: acting appropriately   ED Results / Procedures / Treatments   Labs (all labs ordered are listed, but only abnormal results are displayed) Labs Reviewed - No data to display  EKG None  Radiology No results found.  Procedures Procedures    Medications Ordered in ED Medications  ibuprofen (ADVIL) tablet 600 mg (600 mg Oral Given 12/01/23 1400)    ED Course/ Medical Decision Making/ A&P                                 Medical Decision Making 43 year old female with no reported past medical history presenting to the emergency department today with tissue paper stuck in her left ear.  I did attempt to remove this with alligator forceps and was unsuccessful.  I was able to grab the edge of this but this continues to tear off.  Was being unsuccessful I calls placed to [facility] to  discuss to see if they can see the patient in clinic this afternoon or have any further recommendations here.  I did speak with Dr. Sharrie Rothman who will see the patient in his office this afternoon she is discharged with return precautions.           Final Clinical Impression(s) / ED Diagnoses Final diagnoses:  Foreign body of right ear, initial encounter    Rx / DC Orders ED Discharge Orders     None         Durwin Glaze, MD 12/01/23 [phone]

## 2023-12-01 NOTE — ED Notes (Signed)
Pt knows to be at dr office at 345 to get tissue out of ear

## 2023-12-01 NOTE — ED Triage Notes (Signed)
Pt states that she has tissue stuck in her left ear since yesterday. Pt states that her ear is painful and swollen.

## 2023-12-01 NOTE — Discharge Instructions (Signed)
Please go to the ENT office today at 3:45.  Dr. Ernestene Kiel will be expecting you and can remove the paper from your ear.

## 2023-12-04 ENCOUNTER — Encounter (HOSPITAL_COMMUNITY): Payer: Self-pay | Admitting: *Deleted

## 2023-12-04 ENCOUNTER — Emergency Department (HOSPITAL_COMMUNITY)
Admission: EM | Admit: 2023-12-04 | Discharge: 2023-12-04 | Disposition: A | Discharge status: Home / Self Care | Payer: 59 | Attending: Emergency Medicine | Admitting: Emergency Medicine

## 2023-12-04 ENCOUNTER — Other Ambulatory Visit: Payer: Self-pay

## 2023-12-04 DIAGNOSIS — X58XXXA Exposure to other specified factors, initial encounter: Secondary | ICD-10-CM | POA: Insufficient documentation

## 2023-12-04 DIAGNOSIS — T162XXA Foreign body in left ear, initial encounter: Secondary | ICD-10-CM | POA: Diagnosis not present

## 2023-12-04 DIAGNOSIS — T162XXD Foreign body in left ear, subsequent encounter: Secondary | ICD-10-CM

## 2023-12-04 MED ORDER — ACETAMINOPHEN 325 MG PO TABS
650.0000 mg | ORAL_TABLET | Freq: Four times a day (QID) | ORAL | Status: DC | PRN
Start: 2023-12-04 — End: 2023-12-04
  Administered 2023-12-04: 650 mg via ORAL
  Filled 2023-12-04: qty 2

## 2023-12-04 NOTE — ED Triage Notes (Signed)
Here by POV from home for f/b in L ear. Describes as tissue placed 2d ago, Friday while cleaning ears. Compacted tissue noted in L EAC, visible with otoscope. Was seen at [facility] on Friday. Wasn't able to get out with alligator forceps, was making it worse, did not tolerate well. Wants numbing medicine. Describes as very TTP. L otalgia.

## 2023-12-04 NOTE — ED Provider Notes (Cosign Needed Addendum)
[place] EMERGENCY DEPARTMENT AT [facility] Provider Note   CSN: [id number] Arrival date & time: 12/04/23  1315     History  Chief Complaint  Patient presents with   Foreign Body in Ear    [name] is a 43 y.o. female presents today for tissue stuck in her left ear x 2 days.  Patient was cleaning her ears with a piece of tissue on Friday when he got stuck.  Patient was seen at [facility] by Dr. Rhae Hammock on Friday who was unable to get it out and referred the patient to otolaryngology which the patient did not follow-up with.  Patient endorses pain.  Patient has tried getting it out with a Bobby pin, peroxide, and flushing it with water without success.  Patient denies fever, chills, any other complaints at this time.   Foreign Body in Ear       Home Medications Prior to Admission medications   Medication Sig Start Date End Date Taking? Authorizing Provider  acetaminophen (TYLENOL) 325 MG tablet Take 650 mg by mouth every 6 (six) hours as needed.    [provider]  amoxicillin-clavulanate (AUGMENTIN) 875-125 MG tablet Take 1 tablet by mouth every 12 (twelve) hours. 01/22/23   Terrilee Files, MD  cyclobenzaprine (FLEXERIL) 10 MG tablet Take 1 tablet (10 mg total) by mouth 2 (two) times daily as needed for muscle spasms. 07/20/22   Curatolo, Adam, DO  fluconazole (DIFLUCAN) 150 MG tablet Take 1 tablet (150 mg total) by mouth every three (3) days as needed. 01/26/23   Junie Spencer, FNP  ibuprofen (ADVIL,MOTRIN) 600 MG tablet Take 600 mg by mouth every 6 (six) hours as needed.    [provider]  methylPREDNISolone (MEDROL DOSEPAK) 4 MG TBPK tablet Follow package insert 07/20/22   Curatolo, Adam, DO      Allergies    Patient has no known allergies.    Review of Systems   Review of Systems  HENT:  Positive for ear pain.     Physical Exam Updated Vital Signs BP (!) 148/107 (BP Location: Right Arm)   Pulse 86   Temp 98.5 F (36.9 C) (Oral)    Resp 16   Wt 113 kg   LMP 11/27/2023 (Exact Date)   SpO2 99%   BMI 42.76 kg/m  Physical Exam Vitals and nursing note reviewed.  Constitutional:      General: She is not in acute distress.    Appearance: She is well-developed. She is obese. She is not ill-appearing.  HENT:     Head: Normocephalic and atraumatic.     Right Ear: External ear normal.     Ears:     Comments: White, obviously damp foreign body occluding entire left ear canal.    Nose: Nose normal.  Eyes:     Extraocular Movements: Extraocular movements intact.     Conjunctiva/sclera: Conjunctivae normal.  Cardiovascular:     Rate and Rhythm: Normal rate and regular rhythm.     Pulses: Normal pulses.     Heart sounds: Normal heart sounds. No murmur heard. Pulmonary:     Effort: Pulmonary effort is normal. No respiratory distress.     Breath sounds: Normal breath sounds.  Abdominal:     Palpations: Abdomen is soft.     Tenderness: There is no abdominal tenderness.  Musculoskeletal:        General: No swelling.     Cervical back: Neck supple.  Skin:    General:  Skin is warm and dry.     Capillary Refill: Capillary refill takes less than 2 seconds.  Neurological:     General: No focal deficit present.     Mental Status: She is alert.     Motor: No weakness.  Psychiatric:        Mood and Affect: Mood normal.     ED Results / Procedures / Treatments   Labs (all labs ordered are listed, but only abnormal results are displayed) Labs Reviewed - No data to display  EKG None  Radiology No results found.  Procedures .Foreign Body Removal  Date/Time: 12/04/2023 3:28 PM  Performed by: Dolphus Jenny, PA-C Authorized by: Dolphus Jenny, PA-C  Consent: Verbal consent obtained. Consent given by: patient Patient understanding: patient states understanding of the procedure being performed Patient consent: the patient's understanding of the procedure matches consent given Patient identity confirmed: verbally  with patient Body area: ear Location details: left ear  Sedation: Patient sedated: no  Patient restrained: no Patient cooperative: yes Localization method: visualized Removal mechanism: irrigation Complexity: complex 0 objects recovered. Post-procedure assessment: foreign body not removed      Medications Ordered in ED Medications  acetaminophen (TYLENOL) tablet 650 mg (has no administration in time range)    ED Course/ Medical Decision Making/ A&P                                 Medical Decision Making Amount and/or Complexity of Data Reviewed Radiology: ordered.  Risk OTC drugs.   This patient presents to the ED with chief complaint(s) of foreign body in left ear with pertinent past medical history of none which further complicates the presenting complaint. The complaint involves an extensive differential diagnosis and also carries with it a high risk of complications and morbidity.    The differential diagnosis includes foreign body in left ear, cerumen impaction, otitis media, otitis externa   ED Course and Reassessment: Unsuccessful pressure irrigation of the left ear with 500 cc NS  Consultation: - Consulted or discussed management/test interpretation w/ external professional: ENT, Dr. Allena Katz who stated the patient should call his office or [facility] tomorrow to schedule an appointment.  Consideration for admission or further workup: Considered for admission or further workup however patient's vital signs and physical exam were reassuring.  Patient should alternate taking Tylenol and Motrin as needed for pain.  Patient should follow-up with ENT for further evaluation and treatment.         Final Clinical Impression(s) / ED Diagnoses Final diagnoses:  Foreign body of left ear, subsequent encounter    Rx / DC Orders ED Discharge Orders     None         Dolphus Jenny, PA-C 12/04/23 1556    Dolphus Jenny, PA-C 12/04/23 1601     Bethann Berkshire, MD 12/05/23 1150

## 2023-12-04 NOTE — Discharge Instructions (Signed)
Today you are seen for a foreign body in your left ear.  Please follow-up with one of the ENT practices on your after visit summary to schedule an appointment to get this removed.  You may alternate taking Tylenol and Motrin as needed for ear pain.  Thank you for letting us treat you today. After performing a physical exam, I feel you are safe to go home. Please follow up with your PCP in the next several days and provide them with your records from this visit. Return to the Emergency Room if pain becomes severe or symptoms worsen.

## 2023-12-09 ENCOUNTER — Telehealth: Payer: Self-pay

## 2023-12-09 NOTE — Progress Notes (Signed)
Transition Care Management Unsuccessful Follow-up Telephone Call  Date of discharge and from where:  [facility]  Attempts:  1st Attempt  Reason for unsuccessful TCM follow-up call:  No answer/busy   Lenard Forth [facility] Guide, Phone: [phone] Fax: [phone] Website: [email or web address]

## 2023-12-12 ENCOUNTER — Telehealth: Payer: Self-pay

## 2023-12-12 NOTE — Progress Notes (Signed)
Transition Care Management Unsuccessful Follow-up Telephone Call  Date of discharge and from where:  [name] 1/19  Attempts:  2nd Attempt  Reason for unsuccessful TCM follow-up call:  No answer/busy   [name] [facility] Guide, Phone: [phone] Fax: [phone] Website: [email or web address]
